# Patient Record
Sex: Female | Born: 1935 | Race: White | Hispanic: No | Smoking: Never smoker
Health system: Southern US, Community
[De-identification: ages and names within clinical notes are randomized; demographics above are authoritative.]

## PROBLEM LIST (undated history)

## (undated) DIAGNOSIS — N83209 Unspecified ovarian cyst, unspecified side: Secondary | ICD-10-CM

## (undated) DIAGNOSIS — I10 Essential (primary) hypertension: Secondary | ICD-10-CM

---

## 2004-04-18 ENCOUNTER — Ambulatory Visit: Payer: Self-pay | Admitting: Internal Medicine

## 2005-05-27 ENCOUNTER — Ambulatory Visit: Payer: Self-pay | Admitting: Internal Medicine

## 2006-06-17 ENCOUNTER — Ambulatory Visit: Payer: Self-pay | Admitting: Internal Medicine

## 2007-07-06 ENCOUNTER — Ambulatory Visit: Payer: Self-pay | Admitting: Internal Medicine

## 2008-07-10 ENCOUNTER — Ambulatory Visit: Payer: Self-pay | Admitting: Internal Medicine

## 2010-10-13 ENCOUNTER — Ambulatory Visit: Payer: Self-pay | Admitting: Oncology

## 2010-11-08 ENCOUNTER — Ambulatory Visit: Payer: Self-pay | Admitting: Internal Medicine

## 2010-11-12 ENCOUNTER — Ambulatory Visit: Payer: Self-pay | Admitting: Oncology

## 2010-11-13 ENCOUNTER — Ambulatory Visit: Payer: Self-pay | Admitting: Oncology

## 2010-11-13 LAB — CA 125: CA 125: 19 U/mL (ref 0.0–34.0)

## 2010-11-13 LAB — CANCER ANTIGEN 19-9: CA 19-9: 19 U/mL (ref 0–35)

## 2010-11-13 LAB — CEA: CEA: 2.8 ng/mL (ref 0.0–4.7)

## 2010-11-26 ENCOUNTER — Ambulatory Visit: Payer: Self-pay | Admitting: Oncology

## 2010-12-14 ENCOUNTER — Ambulatory Visit: Payer: Self-pay | Admitting: Oncology

## 2011-01-13 ENCOUNTER — Ambulatory Visit: Payer: Self-pay | Admitting: Oncology

## 2011-02-13 ENCOUNTER — Ambulatory Visit: Payer: Self-pay | Admitting: Oncology

## 2011-02-26 ENCOUNTER — Ambulatory Visit: Payer: Self-pay | Admitting: Oncology

## 2011-03-15 ENCOUNTER — Ambulatory Visit: Payer: Self-pay | Admitting: Oncology

## 2011-06-03 ENCOUNTER — Ambulatory Visit: Payer: Self-pay | Admitting: Oncology

## 2011-06-03 LAB — CBC CANCER CENTER
Basophil #: 0.1 x10 3/mm (ref 0.0–0.1)
Basophil %: 0.7 %
Eosinophil #: 0.1 x10 3/mm (ref 0.0–0.7)
HGB: 11.4 g/dL — ABNORMAL LOW (ref 12.0–16.0)
Lymphocyte #: 1.4 x10 3/mm (ref 1.0–3.6)
Lymphocyte %: 13.5 %
MCH: 35.2 pg — ABNORMAL HIGH (ref 26.0–34.0)
MCHC: 33.3 g/dL (ref 32.0–36.0)
MCV: 106 fL — ABNORMAL HIGH (ref 80–100)
Monocyte #: 0.7 x10 3/mm (ref 0.0–0.7)
Neutrophil %: 78 %
Platelet: 183 x10 3/mm (ref 150–440)
RBC: 3.24 10*6/uL — ABNORMAL LOW (ref 3.80–5.20)
RDW: 14.6 % — ABNORMAL HIGH (ref 11.5–14.5)

## 2011-06-03 LAB — COMPREHENSIVE METABOLIC PANEL
Albumin: 3.8 g/dL (ref 3.4–5.0)
Anion Gap: 5 — ABNORMAL LOW (ref 7–16)
BUN: 23 mg/dL — ABNORMAL HIGH (ref 7–18)
Creatinine: 1.06 mg/dL (ref 0.60–1.30)
EGFR (African American): >60
EGFR (Non-African Amer.): 52 — ABNORMAL LOW
Glucose: 86 mg/dL (ref 65–99)
Osmolality: 264 (ref 275–301)
Potassium: 4.2 mmol/L (ref 3.5–5.1)
SGOT(AST): 26 U/L (ref 15–37)
Sodium: 130 mmol/L — ABNORMAL LOW (ref 136–145)
Total Protein: 6.5 g/dL (ref 6.4–8.2)

## 2011-06-13 ENCOUNTER — Ambulatory Visit: Payer: Self-pay | Admitting: Oncology

## 2011-12-01 ENCOUNTER — Ambulatory Visit: Payer: Self-pay | Admitting: Oncology

## 2011-12-02 ENCOUNTER — Ambulatory Visit: Payer: Self-pay | Admitting: Oncology

## 2011-12-02 LAB — CBC CANCER CENTER
Basophil #: 0.1 x10 3/mm (ref 0.0–0.1)
Basophil %: 0.9 %
Eosinophil #: 0.1 x10 3/mm (ref 0.0–0.7)
HCT: 36.1 % (ref 35.0–47.0)
Lymphocyte %: 12.9 %
MCHC: 32.8 g/dL (ref 32.0–36.0)
Neutrophil #: 6.6 x10 3/mm — ABNORMAL HIGH (ref 1.4–6.5)
Neutrophil %: 77.6 %
RBC: 3.47 10*6/uL — ABNORMAL LOW (ref 3.80–5.20)
RDW: 14.3 % (ref 11.5–14.5)

## 2011-12-02 LAB — COMPREHENSIVE METABOLIC PANEL
Albumin: 3.7 g/dL (ref 3.4–5.0)
Anion Gap: 8 (ref 7–16)
Calcium, Total: 8.9 mg/dL (ref 8.5–10.1)
Chloride: 93 mmol/L — ABNORMAL LOW (ref 98–107)
Co2: 29 mmol/L (ref 21–32)
Glucose: 99 mg/dL (ref 65–99)
Osmolality: 265 (ref 275–301)
Potassium: 4.2 mmol/L (ref 3.5–5.1)
SGOT(AST): 24 U/L (ref 15–37)
Sodium: 130 mmol/L — ABNORMAL LOW (ref 136–145)

## 2011-12-14 ENCOUNTER — Ambulatory Visit: Payer: Self-pay | Admitting: Oncology

## 2012-06-12 ENCOUNTER — Ambulatory Visit: Payer: Self-pay | Admitting: Oncology

## 2012-06-22 ENCOUNTER — Ambulatory Visit: Payer: Self-pay | Admitting: Oncology

## 2012-06-29 LAB — COMPREHENSIVE METABOLIC PANEL
Albumin: 3.8 g/dL (ref 3.4–5.0)
BUN: 41 mg/dL — ABNORMAL HIGH (ref 7–18)
Calcium, Total: 8.6 mg/dL (ref 8.5–10.1)
Chloride: 96 mmol/L — ABNORMAL LOW (ref 98–107)
Co2: 31 mmol/L (ref 21–32)
EGFR (African American): 35 — ABNORMAL LOW
Glucose: 75 mg/dL (ref 65–99)
Potassium: 4.4 mmol/L (ref 3.5–5.1)
SGOT(AST): 20 U/L (ref 15–37)
SGPT (ALT): 19 U/L (ref 12–78)
Sodium: 133 mmol/L — ABNORMAL LOW (ref 136–145)
Total Protein: 6.6 g/dL (ref 6.4–8.2)

## 2012-06-29 LAB — CBC CANCER CENTER
Basophil #: 0.1 x10 3/mm (ref 0.0–0.1)
Basophil %: 1.3 %
Eosinophil #: 0.2 x10 3/mm (ref 0.0–0.7)
HGB: 11.5 g/dL — ABNORMAL LOW (ref 12.0–16.0)
Lymphocyte #: 1.7 x10 3/mm (ref 1.0–3.6)
Lymphocyte %: 18.8 %
MCH: 35 pg — ABNORMAL HIGH (ref 26.0–34.0)
MCHC: 33.8 g/dL (ref 32.0–36.0)
Neutrophil %: 69.1 %
RBC: 3.28 10*6/uL — ABNORMAL LOW (ref 3.80–5.20)
RDW: 14.5 % (ref 11.5–14.5)

## 2012-07-13 ENCOUNTER — Ambulatory Visit: Payer: Self-pay | Admitting: Oncology

## 2013-01-06 ENCOUNTER — Ambulatory Visit: Payer: Self-pay | Admitting: Oncology

## 2013-01-06 LAB — CBC CANCER CENTER
Basophil #: 0.1 x10 3/mm (ref 0.0–0.1)
Eosinophil %: 2.2 %
HCT: 35.1 % (ref 35.0–47.0)
HGB: 11.9 g/dL — ABNORMAL LOW (ref 12.0–16.0)
Lymphocyte %: 13.6 %
MCH: 35.3 pg — ABNORMAL HIGH (ref 26.0–34.0)
MCHC: 34 g/dL (ref 32.0–36.0)
Monocyte %: 9.3 %
Neutrophil %: 73.9 %
Platelet: 194 x10 3/mm (ref 150–440)
RBC: 3.38 10*6/uL — ABNORMAL LOW (ref 3.80–5.20)
WBC: 9.3 x10 3/mm (ref 3.6–11.0)

## 2013-01-06 LAB — COMPREHENSIVE METABOLIC PANEL
Alkaline Phosphatase: 83 U/L (ref 50–136)
Anion Gap: 4 — ABNORMAL LOW (ref 7–16)
Bilirubin,Total: 0.6 mg/dL (ref 0.2–1.0)
Calcium, Total: 8.7 mg/dL (ref 8.5–10.1)
Chloride: 98 mmol/L (ref 98–107)
Co2: 28 mmol/L (ref 21–32)
Creatinine: 1.29 mg/dL (ref 0.60–1.30)
EGFR (African American): 42 — ABNORMAL LOW
EGFR (Non-African Amer.): 36 — ABNORMAL LOW
Glucose: 94 mg/dL (ref 65–99)
Osmolality: 265 (ref 275–301)
SGOT(AST): 25 U/L (ref 15–37)
SGPT (ALT): 20 U/L (ref 12–78)
Total Protein: 6.6 g/dL (ref 6.4–8.2)

## 2013-01-12 ENCOUNTER — Ambulatory Visit: Payer: Self-pay | Admitting: Oncology

## 2013-01-21 IMAGING — CT CT ABD-PELV W/O CM
1 of 2 series · 15 of 32 positions shown, 19 images · non-contrast
Comparison: 11/08/2010

REASON FOR EXAM: ORAL ONLY Abd Mass Compare with Previous CT Scan
COMMENTS:

PROCEDURE:     CT  - CT ABDOMEN AND PELVIS W[DATE]  [DATE]
RESULT:     Indication: Abdominal mass
TECHNIQUE: Multiple axial images from the lung bases to the symphysis pubis
were obtained with oral and without intravenous contrast.

[Series 2: soft tissue · axial · 0.64mm/px · z∈[+67,+412]mm · 15 of 126 slices shown, 19 images]
[im 6/126  soft-tissue]
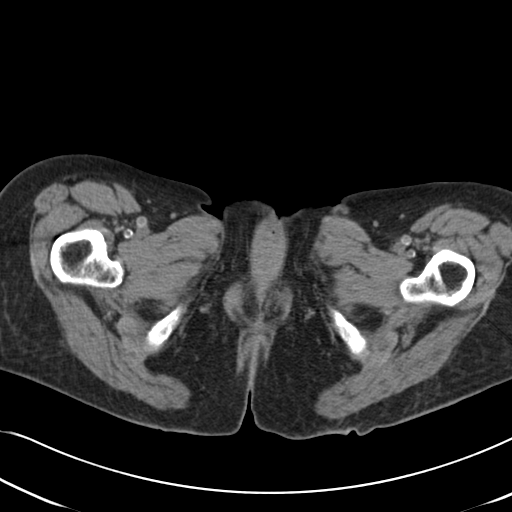
[im 6/126  bone]
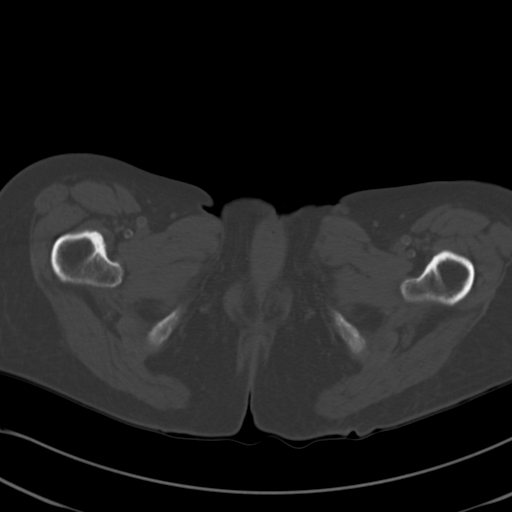
[im 16/126  soft-tissue]
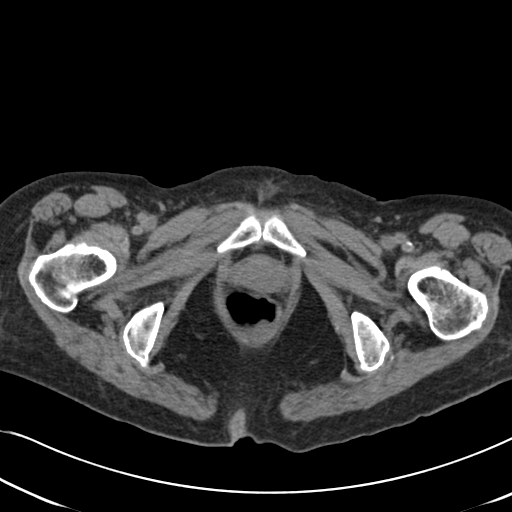
[im 26/126  soft-tissue]
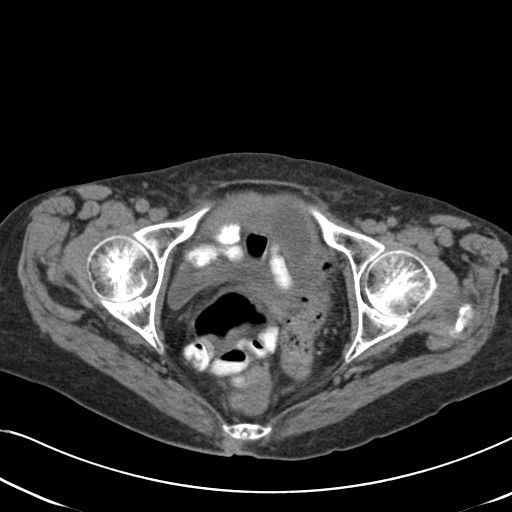
[im 36/126  soft-tissue]
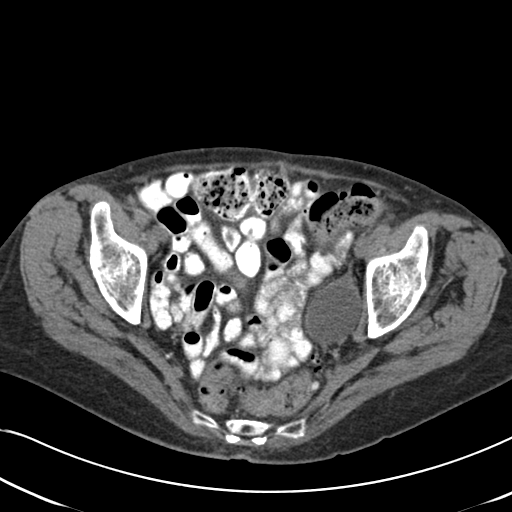
[im 46/126  soft-tissue]
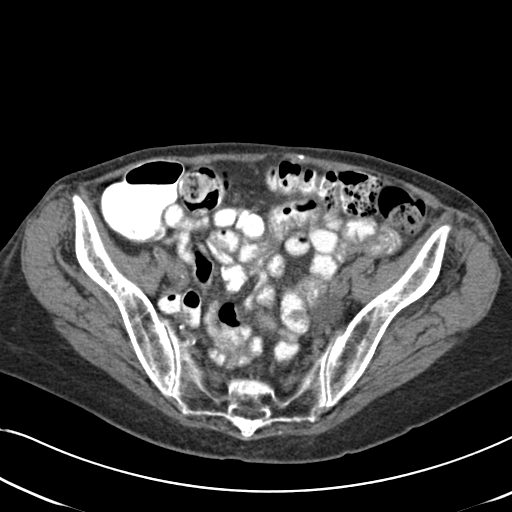
[im 56/126  soft-tissue]
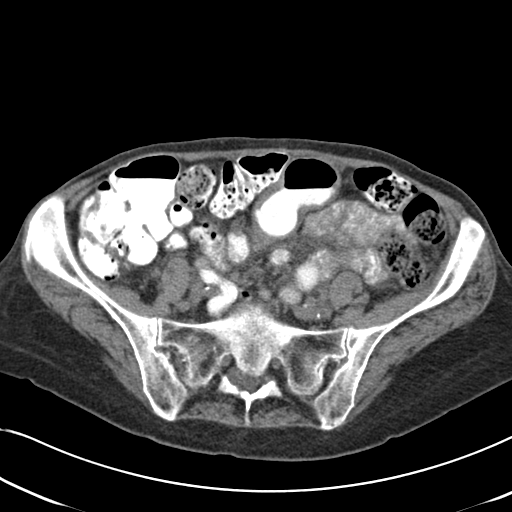
[im 66/126  soft-tissue]
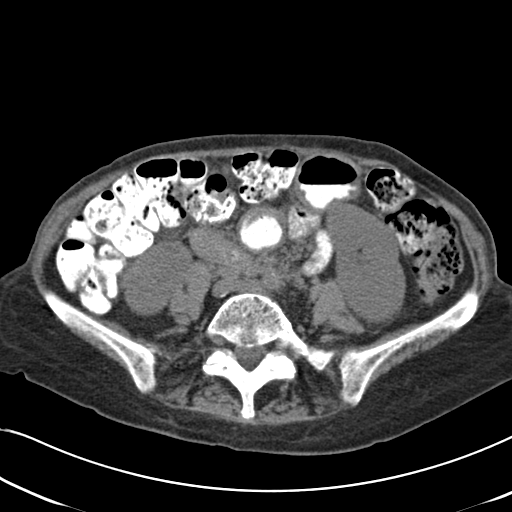
[im 71/126  soft-tissue]
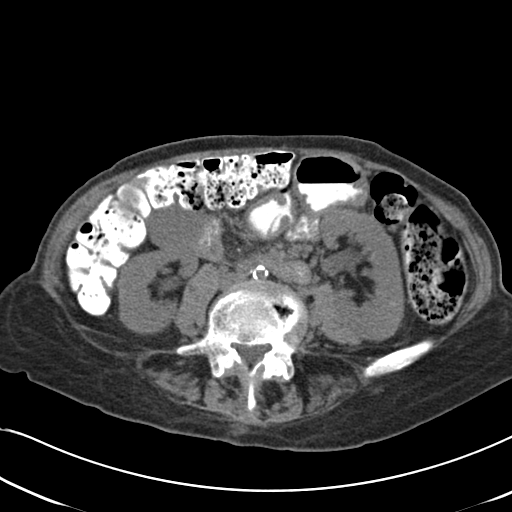
[im 81/126  soft-tissue]
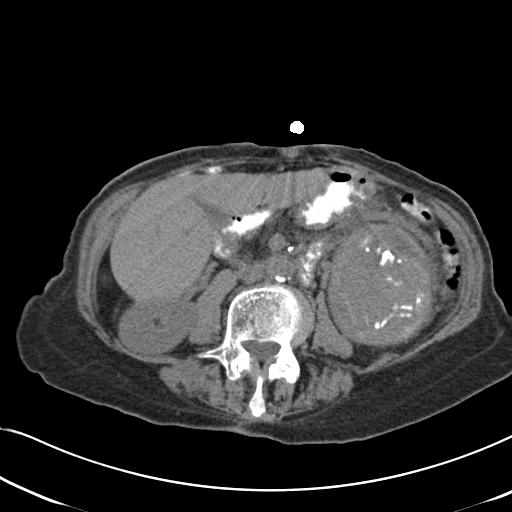
[im 81/126  bone]
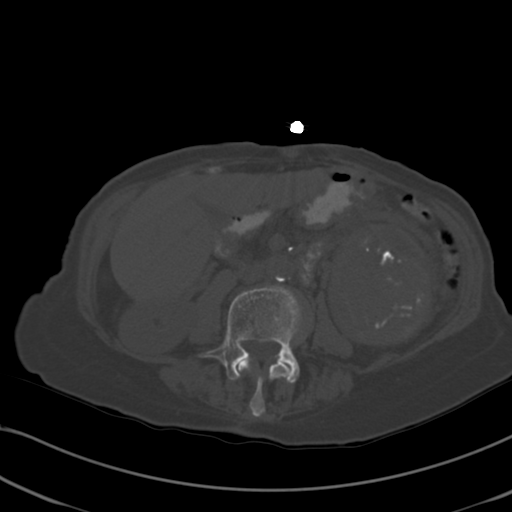
[im 91/126  soft-tissue]
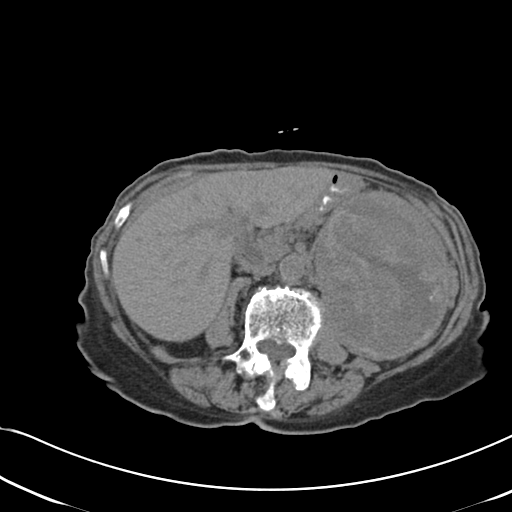
[im 101/126  soft-tissue]
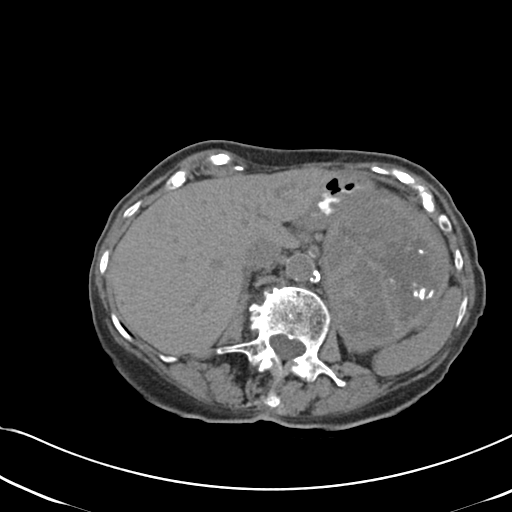
[im 106/126  lung]
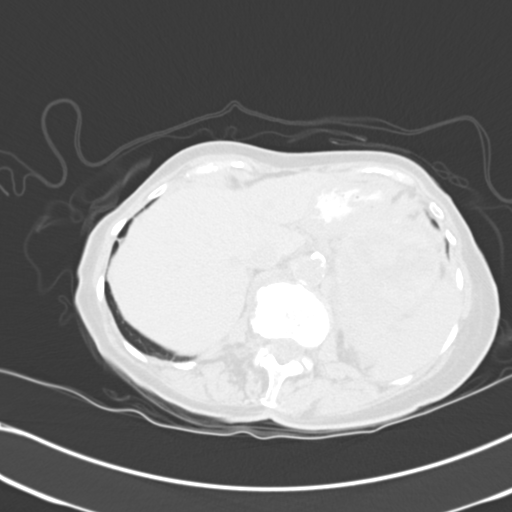
[im 111/126  soft-tissue]
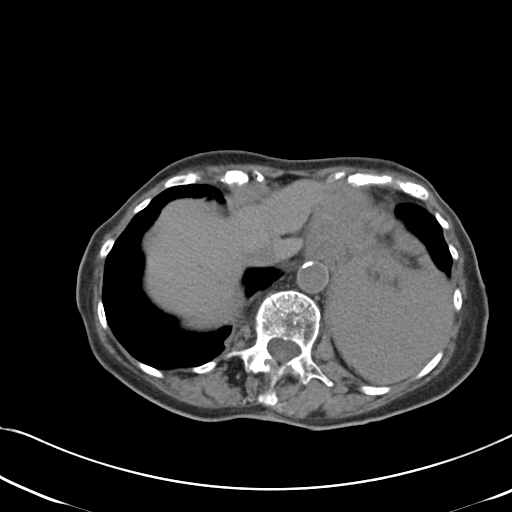
[im 111/126  lung]
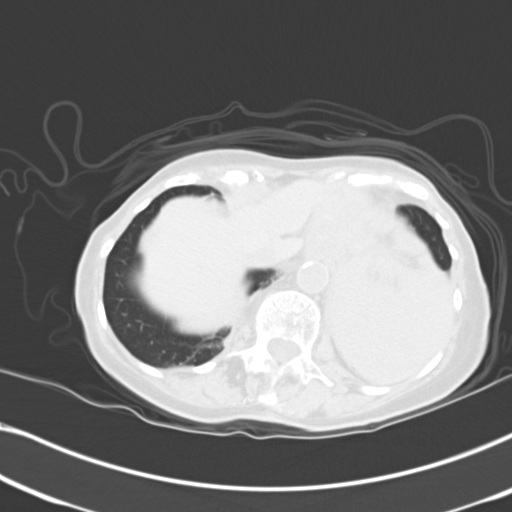
[im 116/126  lung]
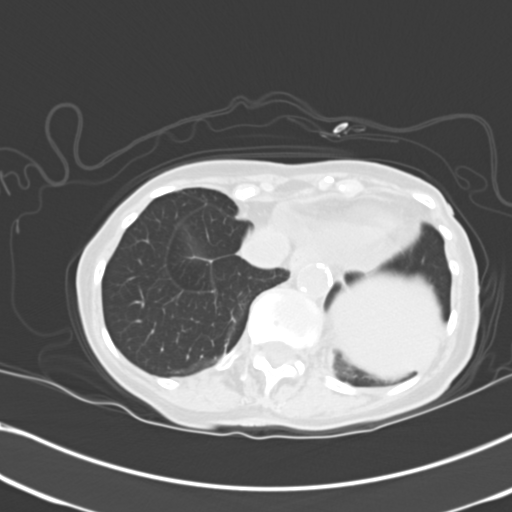
[im 121/126  soft-tissue]
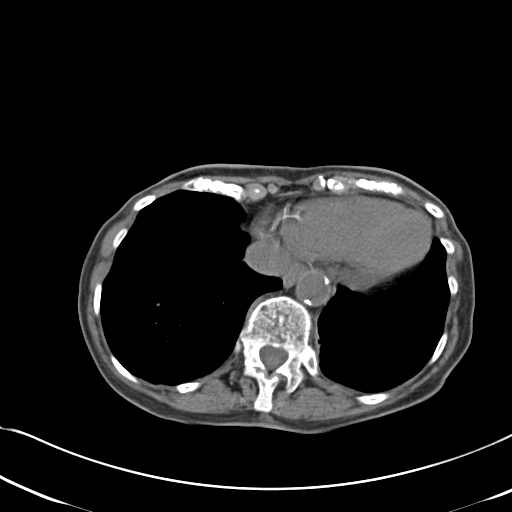
[im 121/126  lung]
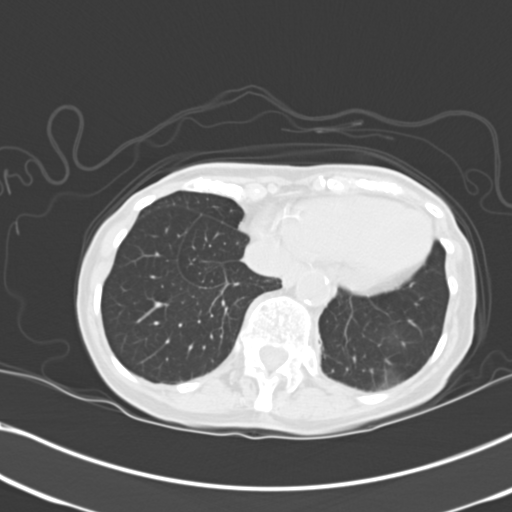

[15 of 32 positions shown; findings below may reference images not displayed]

FINDINGS: The lung bases are clear. There is no pleural or pericardial effusions.

No renal, ureteral, or bladder calculi. No obstructive uropathy. No
perinephric stranding is seen. The kidneys are symmetric in size without
evidence for exophytic mass. The bladder is unremarkable.

The liver demonstrates no focal abnormality. The gallbladder is
unremarkable. The spleen demonstrates no focal abnormality. The adrenal
glands and pancreas are normal.

There is a 11.1 x 8.8 x 10.9 cm left abdominal mass with areas of
low-attenuation in consistent with necrosis and associated coarse
calcifications. The appearance is most concerning for a gist tumor.

There is a 4.4 cm cystic left ovarian mass.

The stomach, duodenum, small intestine, and large intestine are
unremarkable.  There is no pneumoperitoneum, pneumatosis, or portal venous
gas. There is no abdominal or pelvic free fluid. There is no
lymphadenopathy.

The abdominal aorta is normal in caliber with atherosclerosis.

The osseous structures are unremarkable.
IMPRESSION: 1. There is a persistent stable left abdominal mass measuring 11.1 x 8.8 x
10.9 cm.
2. There is a 4.4 cm cystic left ovarian mass which is incompletely
characterized on this exam. Recommend further characterization with a pelvic
ultrasound given the patient's age.

## 2013-07-05 ENCOUNTER — Ambulatory Visit: Payer: Self-pay | Admitting: Oncology

## 2013-07-06 ENCOUNTER — Ambulatory Visit: Payer: Self-pay | Admitting: Oncology

## 2013-07-07 LAB — COMPREHENSIVE METABOLIC PANEL
ALBUMIN: 3.7 g/dL (ref 3.4–5.0)
ANION GAP: 10 (ref 7–16)
AST: 20 U/L (ref 15–37)
Alkaline Phosphatase: 65 U/L
BILIRUBIN TOTAL: 0.4 mg/dL (ref 0.2–1.0)
BUN: 32 mg/dL — ABNORMAL HIGH (ref 7–18)
CO2: 26 mmol/L (ref 21–32)
CREATININE: 1.29 mg/dL (ref 0.60–1.30)
Calcium, Total: 8.9 mg/dL (ref 8.5–10.1)
Chloride: 99 mmol/L (ref 98–107)
EGFR (Non-African Amer.): 36 — ABNORMAL LOW
GFR CALC AF AMER: 41 — AB
GLUCOSE: 90 mg/dL (ref 65–99)
OSMOLALITY: 277 (ref 275–301)
Potassium: 4.5 mmol/L (ref 3.5–5.1)
SGPT (ALT): 17 U/L (ref 12–78)
Sodium: 135 mmol/L — ABNORMAL LOW (ref 136–145)
Total Protein: 6.7 g/dL (ref 6.4–8.2)

## 2013-07-07 LAB — CBC CANCER CENTER
BASOS ABS: 0.2 x10 3/mm — AB (ref 0.0–0.1)
Basophil %: 1.8 %
EOS PCT: 2.3 %
Eosinophil #: 0.3 x10 3/mm (ref 0.0–0.7)
HCT: 35.2 % (ref 35.0–47.0)
HGB: 11.3 g/dL — ABNORMAL LOW (ref 12.0–16.0)
Lymphocyte #: 1.7 x10 3/mm (ref 1.0–3.6)
Lymphocyte %: 14.8 %
MCH: 34.1 pg — AB (ref 26.0–34.0)
MCHC: 32.2 g/dL (ref 32.0–36.0)
MCV: 106 fL — AB (ref 80–100)
MONOS PCT: 7.1 %
Monocyte #: 0.8 x10 3/mm (ref 0.2–0.9)
Neutrophil #: 8.6 x10 3/mm — ABNORMAL HIGH (ref 1.4–6.5)
Neutrophil %: 74 %
Platelet: 202 x10 3/mm (ref 150–440)
RBC: 3.33 10*6/uL — AB (ref 3.80–5.20)
RDW: 14.6 % — AB (ref 11.5–14.5)
WBC: 11.7 x10 3/mm — ABNORMAL HIGH (ref 3.6–11.0)

## 2013-07-13 ENCOUNTER — Ambulatory Visit: Payer: Self-pay | Admitting: Oncology

## 2014-01-12 ENCOUNTER — Ambulatory Visit: Payer: Self-pay | Admitting: Oncology

## 2014-01-12 LAB — COMPREHENSIVE METABOLIC PANEL
ALK PHOS: 76 U/L
Albumin: 3.9 g/dL (ref 3.4–5.0)
Anion Gap: 8 (ref 7–16)
BUN: 23 mg/dL — ABNORMAL HIGH (ref 7–18)
Bilirubin,Total: 0.6 mg/dL (ref 0.2–1.0)
Calcium, Total: 8.9 mg/dL (ref 8.5–10.1)
Chloride: 99 mmol/L (ref 98–107)
Co2: 28 mmol/L (ref 21–32)
Creatinine: 1.1 mg/dL (ref 0.60–1.30)
EGFR (Non-African Amer.): 49 — ABNORMAL LOW
GFR CALC AF AMER: 60 — AB
Glucose: 94 mg/dL (ref 65–99)
Osmolality: 274 (ref 275–301)
POTASSIUM: 4.6 mmol/L (ref 3.5–5.1)
SGOT(AST): 20 U/L (ref 15–37)
SGPT (ALT): 22 U/L
Sodium: 135 mmol/L — ABNORMAL LOW (ref 136–145)
Total Protein: 6.3 g/dL — ABNORMAL LOW (ref 6.4–8.2)

## 2014-01-12 LAB — CBC CANCER CENTER
BASOS PCT: 1.1 %
Basophil #: 0.1 x10 3/mm (ref 0.0–0.1)
Eosinophil #: 0.3 x10 3/mm (ref 0.0–0.7)
Eosinophil %: 2.5 %
HCT: 36.3 % (ref 35.0–47.0)
HGB: 12 g/dL (ref 12.0–16.0)
Lymphocyte #: 1.5 x10 3/mm (ref 1.0–3.6)
Lymphocyte %: 14.9 %
MCH: 35.9 pg — AB (ref 26.0–34.0)
MCHC: 33 g/dL (ref 32.0–36.0)
MCV: 109 fL — ABNORMAL HIGH (ref 80–100)
Monocyte #: 0.7 x10 3/mm (ref 0.2–0.9)
Monocyte %: 7 %
NEUTROS PCT: 74.5 %
Neutrophil #: 7.4 x10 3/mm — ABNORMAL HIGH (ref 1.4–6.5)
Platelet: 177 x10 3/mm (ref 150–440)
RBC: 3.34 10*6/uL — AB (ref 3.80–5.20)
RDW: 14.9 % — ABNORMAL HIGH (ref 11.5–14.5)
WBC: 9.9 x10 3/mm (ref 3.6–11.0)

## 2014-02-12 ENCOUNTER — Ambulatory Visit: Payer: Self-pay | Admitting: Oncology

## 2014-04-05 DIAGNOSIS — R413 Other amnesia: Secondary | ICD-10-CM | POA: Insufficient documentation

## 2014-06-03 IMAGING — US ABDOMEN ULTRASOUND
2 series · 13 of 25 positions shown · non-contrast
Comparison: none

REASON FOR EXAM: Kidney and ovarian mass
COMMENTS:

[Series 1: abdomen ultrasound · 0.18mm/px · 12 of 140 slices shown (1 of 2)]
[im 1/140]
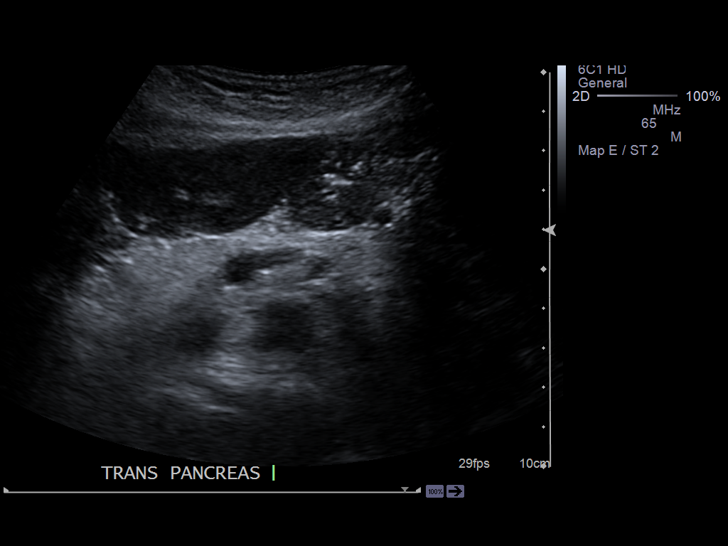
[im 13/140]
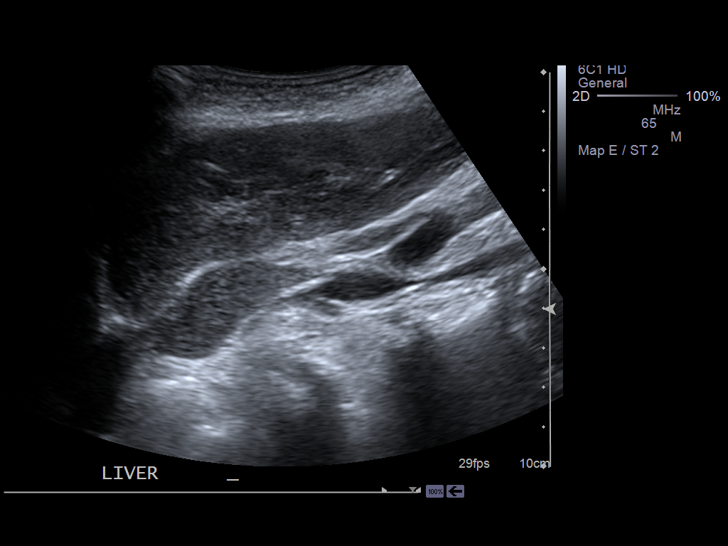
[im 26/140]
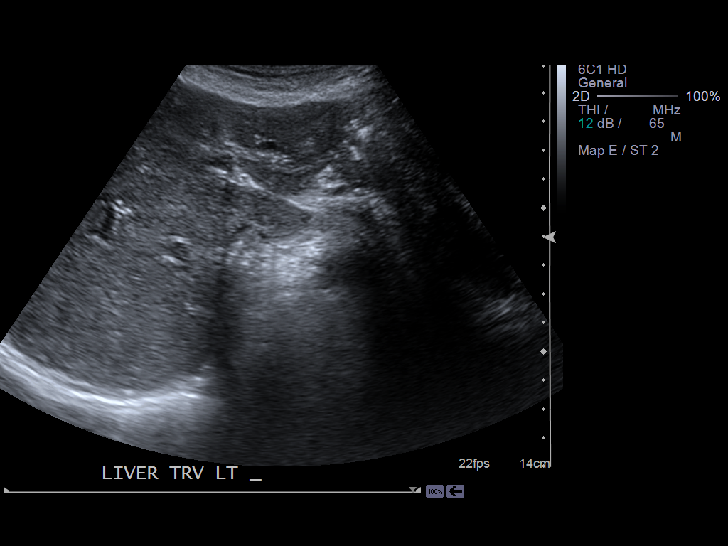
[im 38/140]
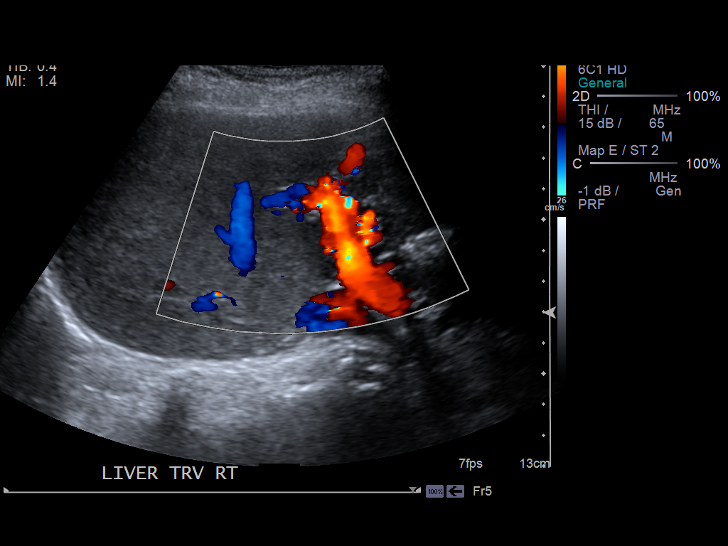
[im 51/140]
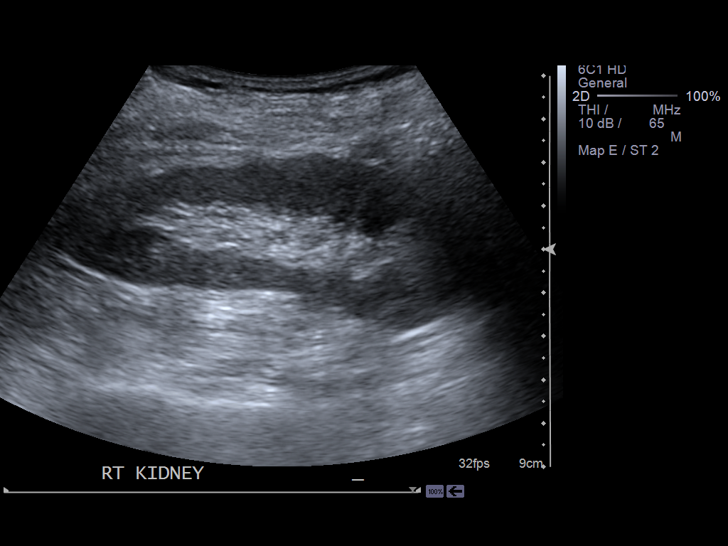
[im 64/140]
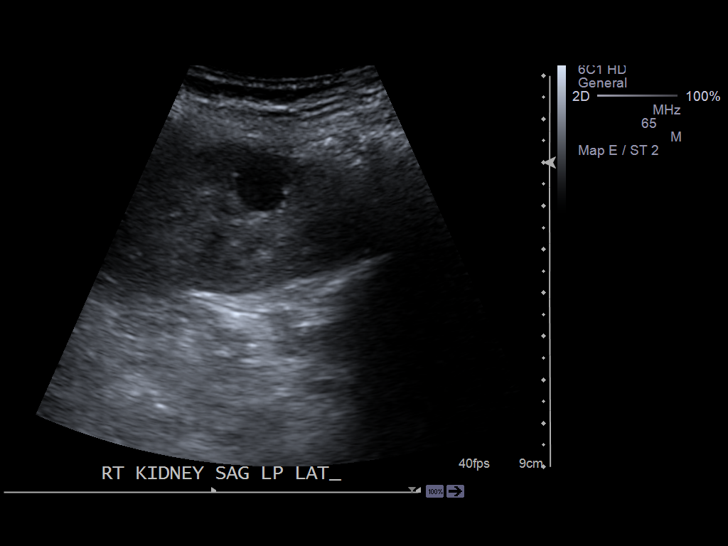
[im 76/140]
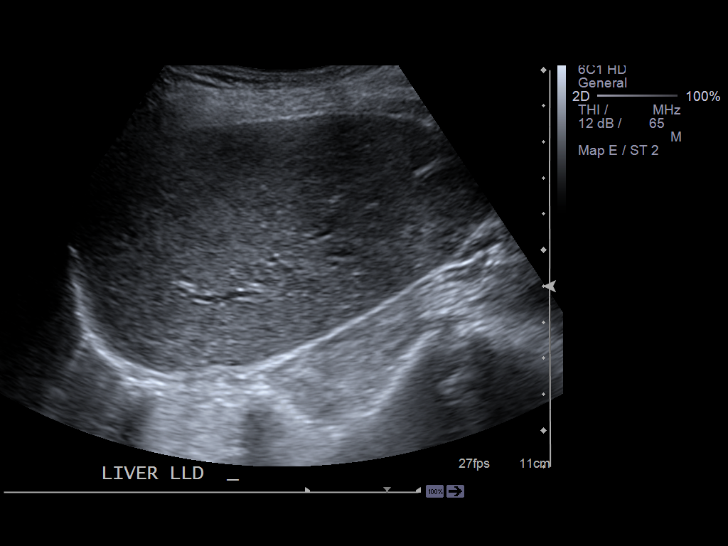
[im 89/140]
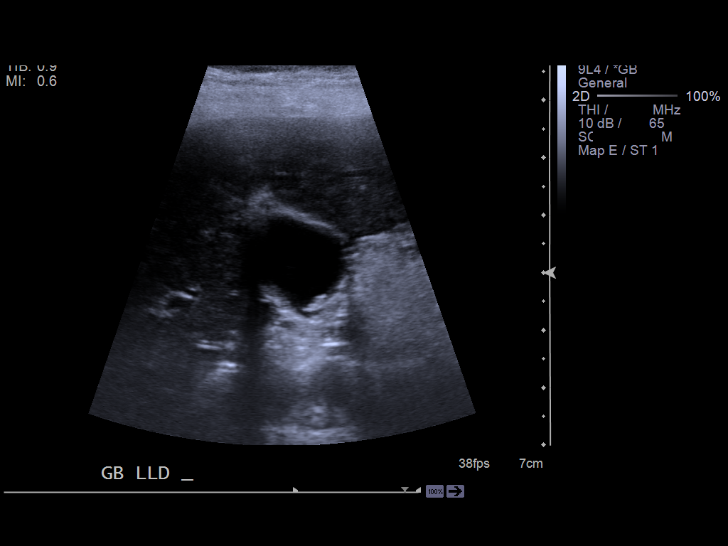
[im 102/140]
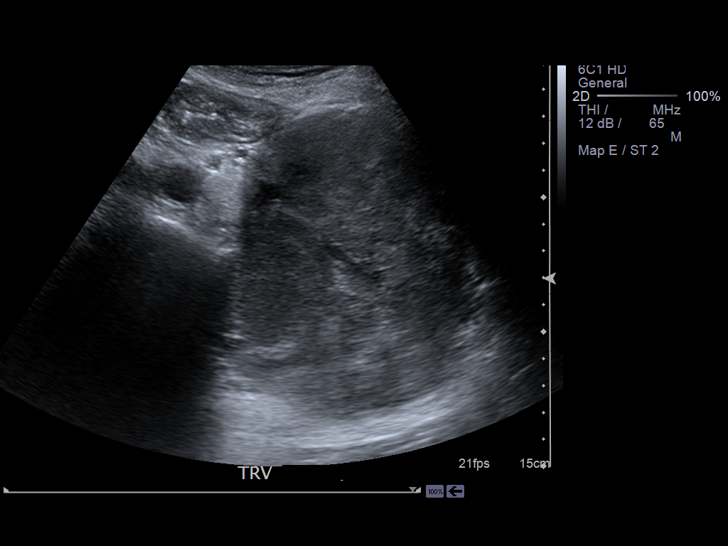
[im 114/140]
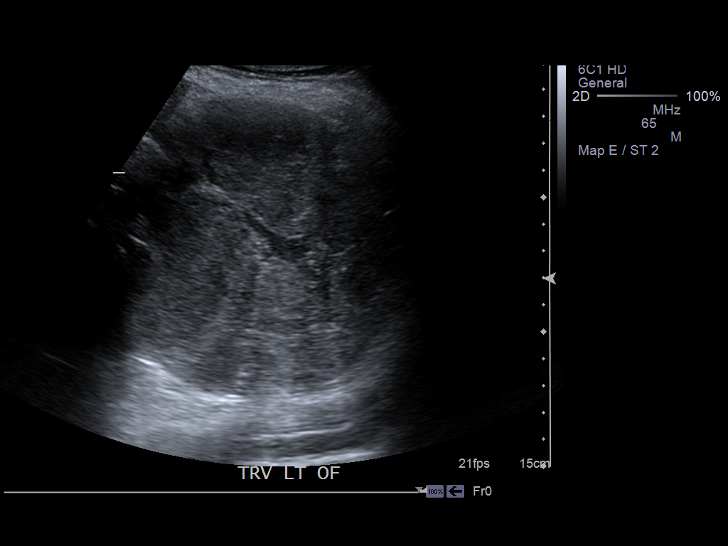
[im 127/140]
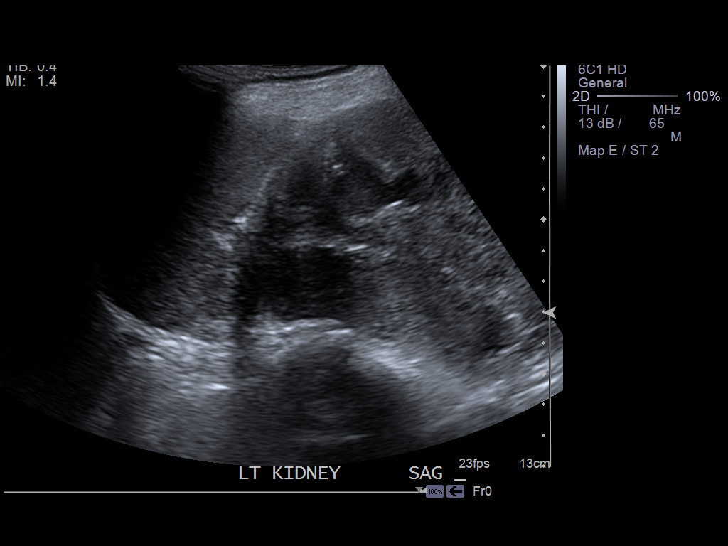
[im 140/140]
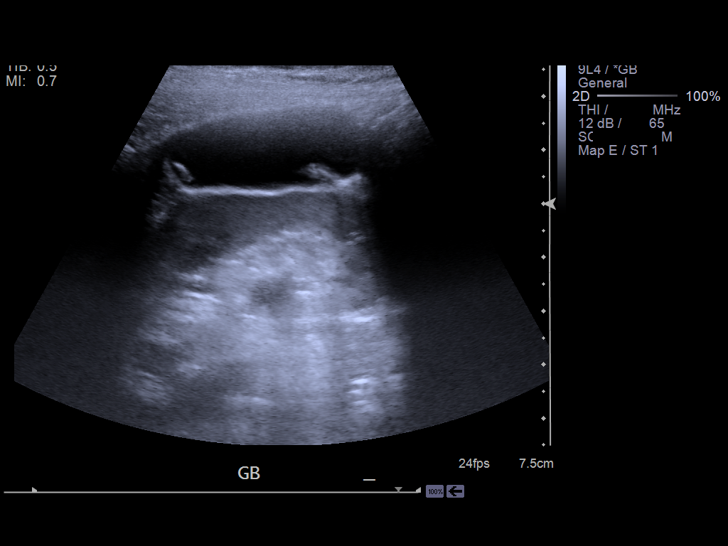

[Series 2001: abdomen ultrasound · 0.23mm/px · 1 of 15 slices shown (2 of 2)]
[im 15/15]
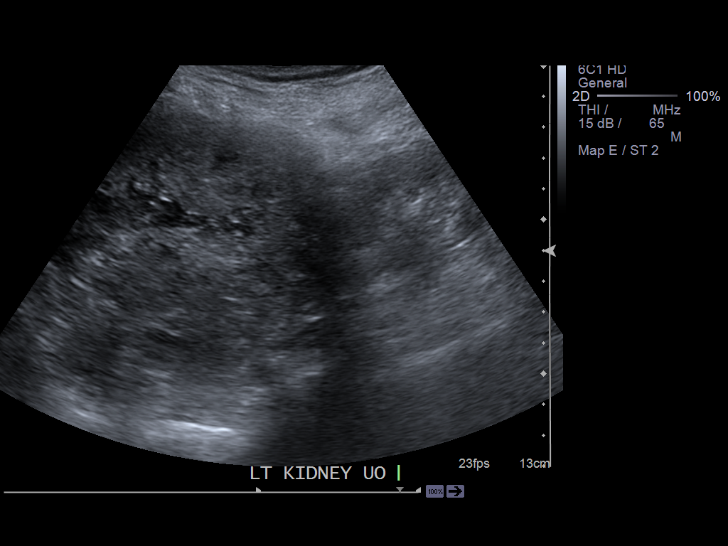

[13 of 25 positions shown; findings below may reference images not displayed]

PROCEDURE:     US  - US ABDOMEN GENERAL SURVEY  - June 22, 2012 [DATE]

RESULT:     Abdominal sonographic evaluation demonstrates limited
visualization of the area the pancreas. The proximal inferior vena cava is
widely patent. The aorta shows no aneurysmal dilation and appears to taper
to 1.27 cm diameter distally. The hepatic echotexture is normal. No discrete
mass is evident. The hepatic length is 13.71 cm. The portal venous flow is
normal. Gallbladder wall thickness is 1.3 mm. The right kidney measures
x 3.65 x 3.57 cm and contains a lower pole cyst measuring up to 1.39 cm
diameter actually more the junction of the mid and lower pole regions. The
common bile duct diameter is 3.4 mm. There is no intrahepatic biliary ductal
dilation. Small echogenic minimally shadowing stones appear present in the
gallbladder. These are mobile and do not show significant shadowing.
Sludgeball is felt to be less likely.

There is a large complex mass in the left abdomen measuring up to 11.6 cm x
9.72 cm x 11.5 cm. The spleen measures 8.9 cm in length.  The borders the
left kidney are not well demonstrated. The kidney measures 9.14 x 4.55 x
3.70 cm.
IMPRESSION: Large left abdominal mass. The patient has undergone
previous CT scan. Right renal cyst. Cholelithiasis without evidence of acute
cholecystitis.

[REDACTED]

## 2014-06-03 IMAGING — US TRANSABDOMINAL ULTRASOUND OF PELVIS
1 series · 14 of 25 positions shown · non-contrast
Comparison: none

REASON FOR EXAM: Kidney and ovarian mass
COMMENTS:

[Series 1: transabdominal ultrasound of pelvis · 0.28mm/px · 14 of 52 slices shown]
[im 1/52]
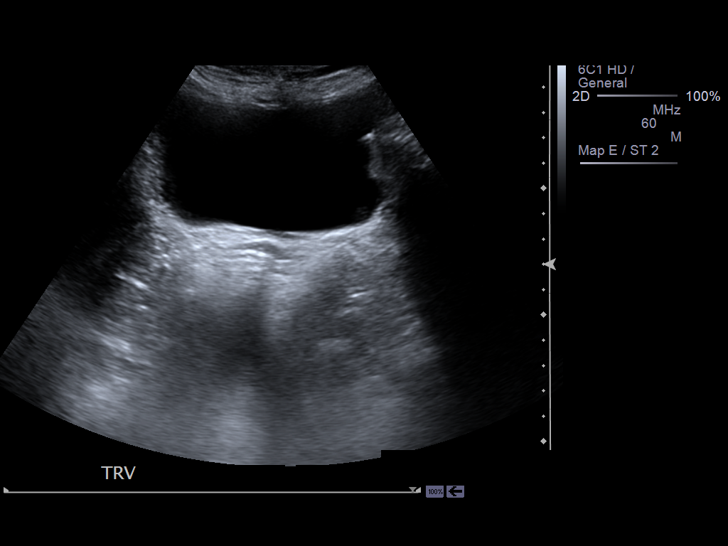
[im 5/52]
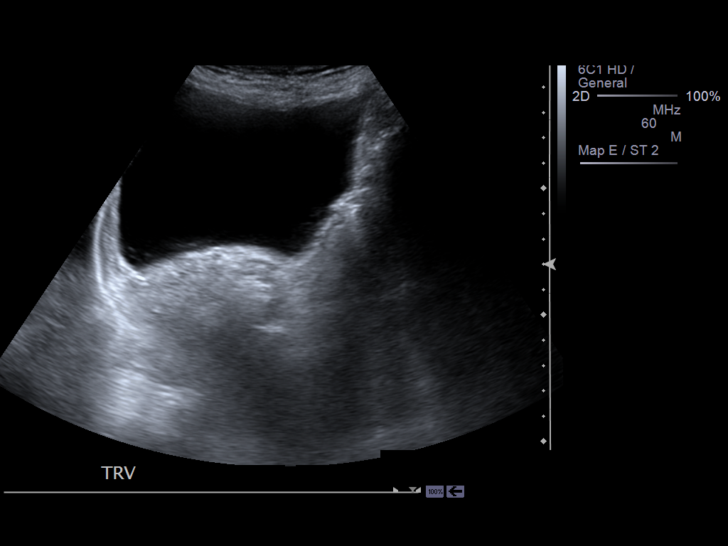
[im 9/52]
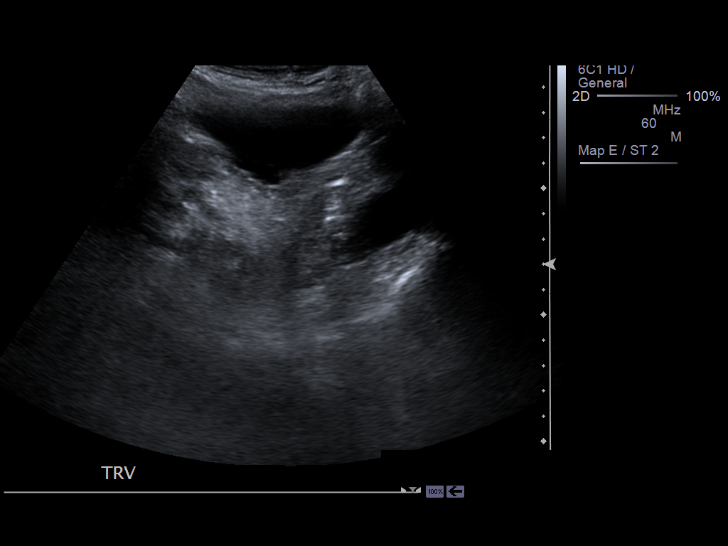
[im 13/52]
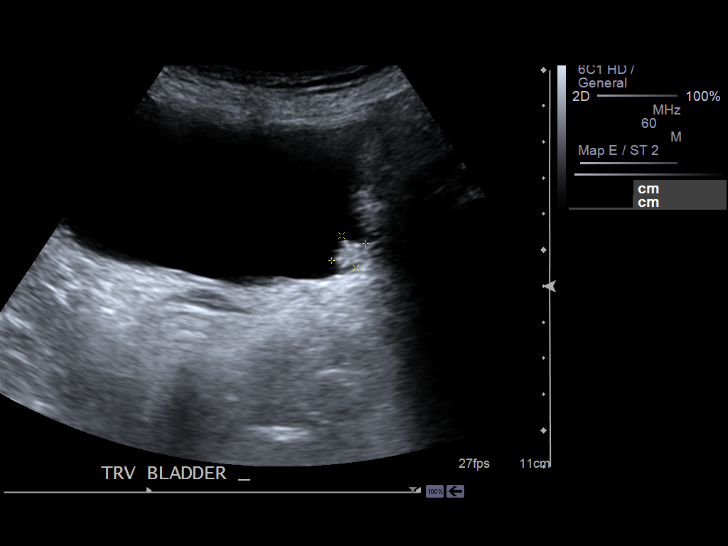
[im 18/52]
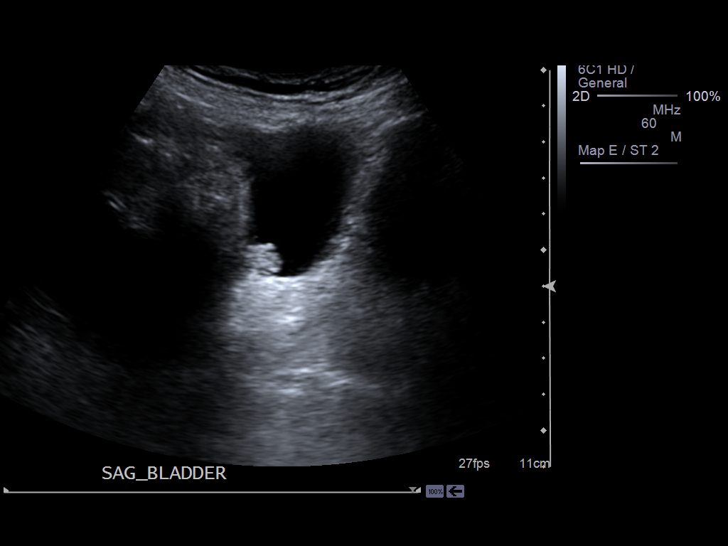
[im 20/52]
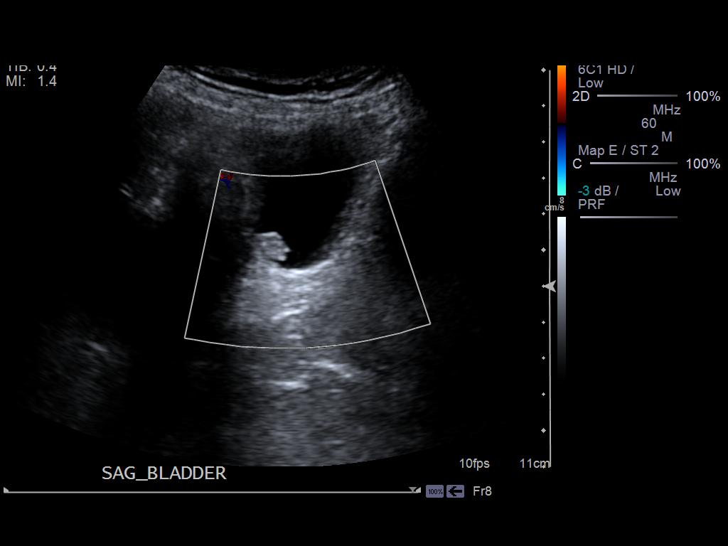
[im 24/52]
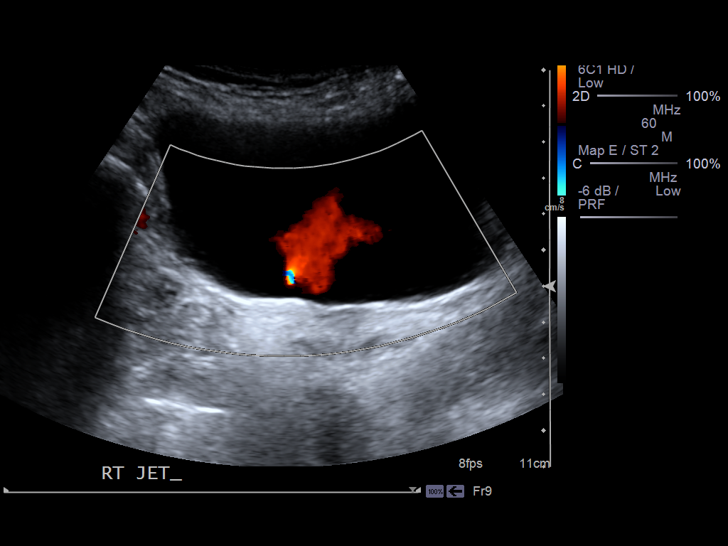
[im 28/52]
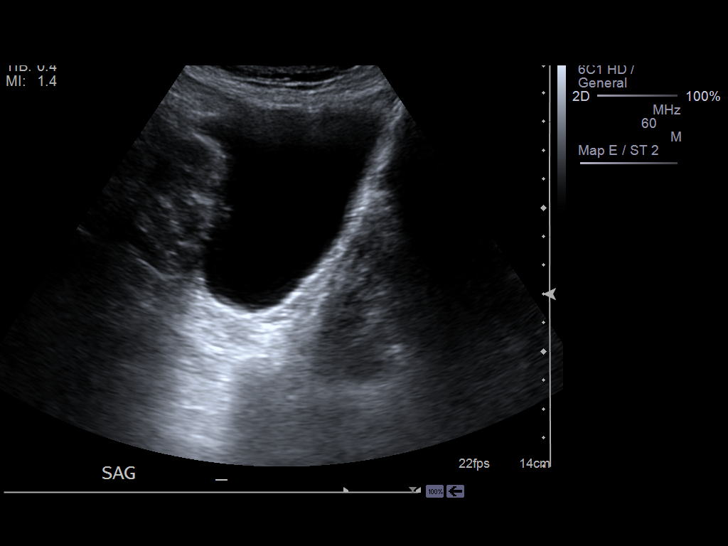
[im 32/52]
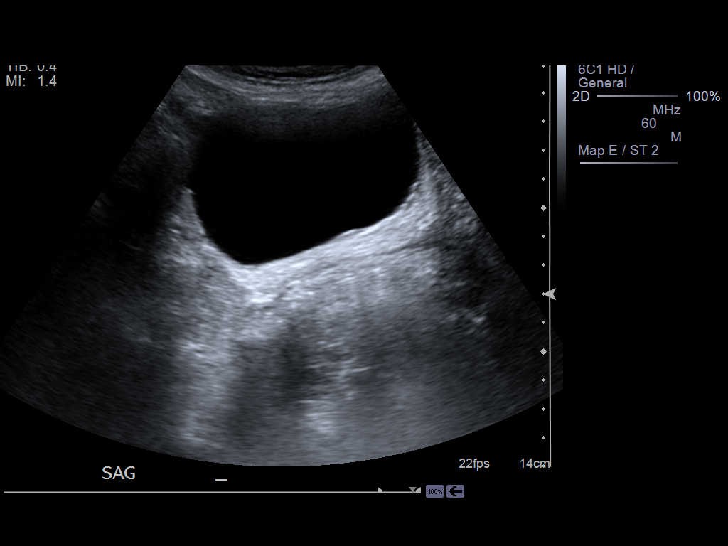
[im 35/52]
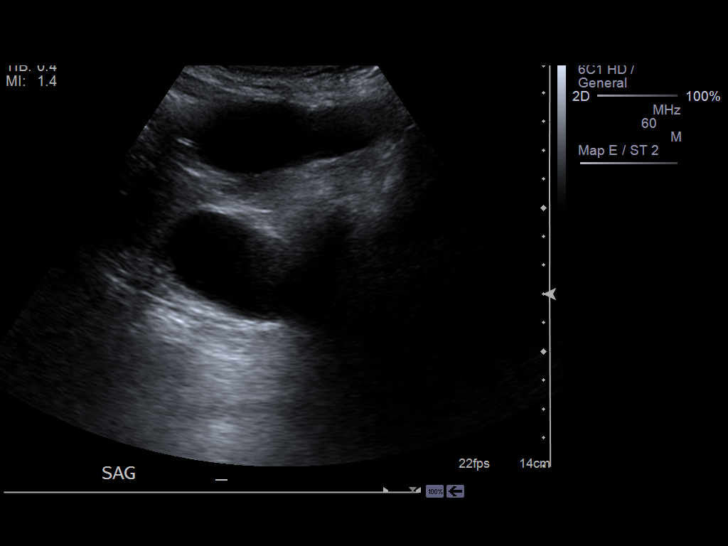
[im 39/52]
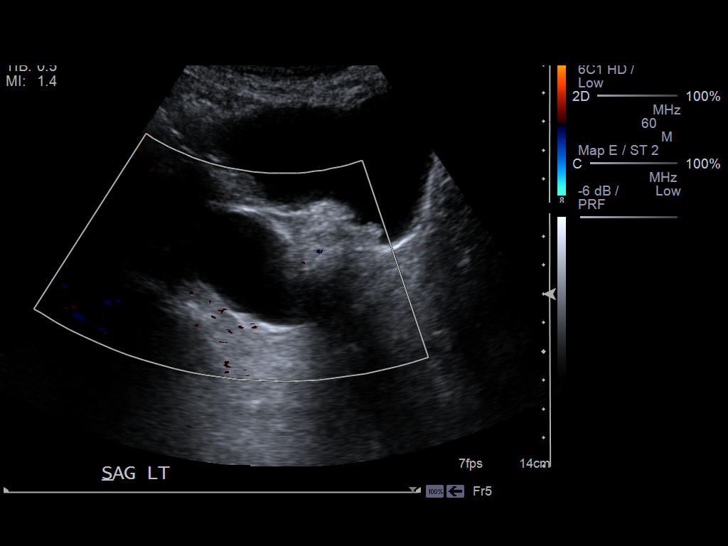
[im 43/52]
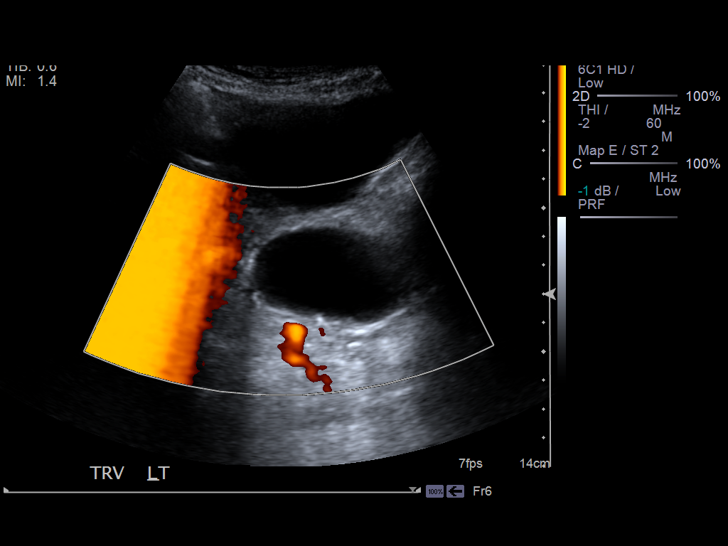
[im 47/52]
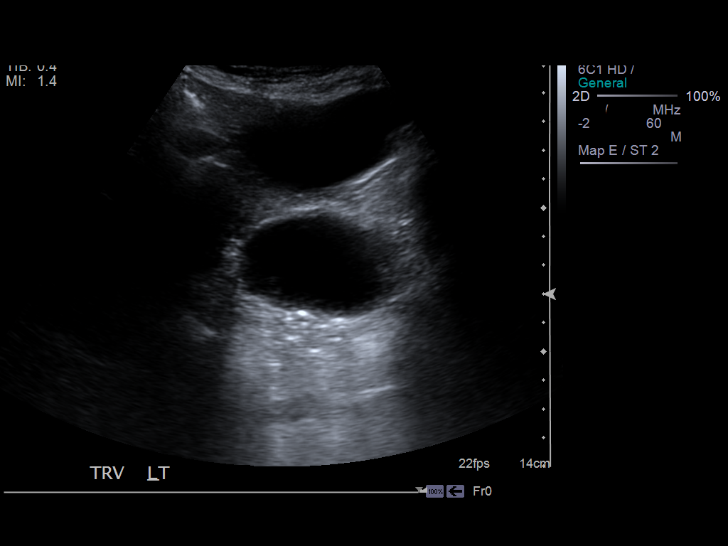
[im 52/52]
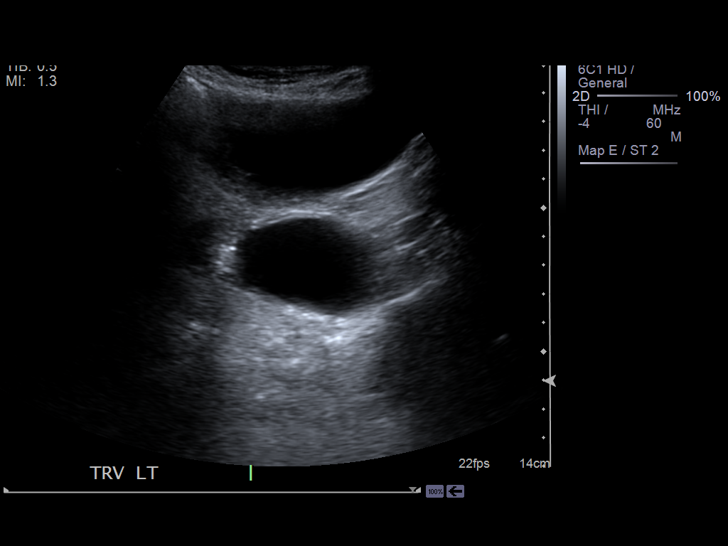

[14 of 25 positions shown; findings below may reference images not displayed]

PROCEDURE:     US  - US PELVIS EXAM  - June 22, 2012 [DATE]

RESULT:     The uterus has been removed. The right ovary is not visualized.
Previous studies demonstrate left ovarian cystic mass. Today's
transabdominal and endovaginal images demonstrate a mass in the bladder
posteriorly to the left measuring 11.2 mm. There is evidence of internal
blood flow within this mass. Underlying bladder cancer is a concern. There
is a cystic mass in the left adnexal region measuring 4.93 x 2.0 x 5.44 cm.
There is no associated soft tissue component. The a mass in the bladder
appears to measure slightly greater than on the previous study from 20 November, 2010. The left adnexal mass is similar in size to the previous study.
IMPRESSION: No gross change in the left bladder mass or left adnexal
cystic mass. Please see above.

[REDACTED]

## 2014-07-12 ENCOUNTER — Ambulatory Visit
Admit: 2014-07-12 | Disposition: A | Discharge status: Home / Self Care | Payer: Self-pay | Attending: Oncology | Admitting: Oncology

## 2014-07-14 ENCOUNTER — Ambulatory Visit
Admit: 2014-07-14 | Disposition: A | Discharge status: Home / Self Care | Payer: Self-pay | Attending: Oncology | Admitting: Oncology

## 2014-07-20 LAB — COMPREHENSIVE METABOLIC PANEL
ALBUMIN: 4 g/dL
ALT: 15 U/L
AST: 21 U/L
Alkaline Phosphatase: 64 U/L
Anion Gap: 4 — ABNORMAL LOW (ref 7–16)
BUN: 34 mg/dL — AB
Bilirubin,Total: 0.7 mg/dL
Calcium, Total: 8.6 mg/dL — ABNORMAL LOW
Chloride: 99 mmol/L — ABNORMAL LOW
Co2: 28 mmol/L
Creatinine: 1.2 mg/dL — ABNORMAL HIGH
EGFR (African American): 45 — ABNORMAL LOW
EGFR (Non-African Amer.): 39 — ABNORMAL LOW
Glucose: 85 mg/dL
POTASSIUM: 4.4 mmol/L
Sodium: 131 mmol/L — ABNORMAL LOW
TOTAL PROTEIN: 6.3 g/dL — AB

## 2014-07-20 LAB — CBC CANCER CENTER
BASOS ABS: 0.1 x10 3/mm (ref 0.0–0.1)
Basophil %: 1.1 %
EOS PCT: 1.6 %
Eosinophil #: 0.2 x10 3/mm (ref 0.0–0.7)
HCT: 33 % — ABNORMAL LOW (ref 35.0–47.0)
HGB: 11.1 g/dL — AB (ref 12.0–16.0)
Lymphocyte #: 1.5 x10 3/mm (ref 1.0–3.6)
Lymphocyte %: 14 %
MCH: 35.9 pg — AB (ref 26.0–34.0)
MCHC: 33.8 g/dL (ref 32.0–36.0)
MCV: 106 fL — AB (ref 80–100)
Monocyte #: 0.8 x10 3/mm (ref 0.2–0.9)
Monocyte %: 7.1 %
NEUTROS PCT: 76.2 %
Neutrophil #: 8.3 x10 3/mm — ABNORMAL HIGH (ref 1.4–6.5)
Platelet: 174 x10 3/mm (ref 150–440)
RBC: 3.1 10*6/uL — ABNORMAL LOW (ref 3.80–5.20)
RDW: 15.3 % — ABNORMAL HIGH (ref 11.5–14.5)
WBC: 10.9 x10 3/mm (ref 3.6–11.0)

## 2014-09-12 ENCOUNTER — Other Ambulatory Visit: Payer: Self-pay | Admitting: *Deleted

## 2014-09-12 MED ORDER — TRAMADOL HCL 50 MG PO TABS: 50.0000 mg | ORAL_TABLET | Freq: Four times a day (QID) | ORAL | Status: DC | PRN

## 2014-09-12 NOTE — Telephone Encounter (Signed)
rx faxed

## 2014-10-16 ENCOUNTER — Emergency Department
Admission: EM | Admit: 2014-10-16 | Discharge: 2014-10-16 | Disposition: A | Discharge status: Home / Self Care | Payer: Medicare Other | Attending: Emergency Medicine | Admitting: Emergency Medicine

## 2014-10-16 ENCOUNTER — Encounter: Payer: Self-pay | Admitting: *Deleted

## 2014-10-16 ENCOUNTER — Emergency Department: Payer: Medicare Other

## 2014-10-16 DIAGNOSIS — M25552 Pain in left hip: Secondary | ICD-10-CM | POA: Insufficient documentation

## 2014-10-16 DIAGNOSIS — G8929 Other chronic pain: Secondary | ICD-10-CM | POA: Insufficient documentation

## 2014-10-16 HISTORY — DX: Essential (primary) hypertension: I10

## 2014-10-16 HISTORY — DX: Unspecified ovarian cyst, unspecified side: N83.209

## 2014-10-16 NOTE — Discharge Instructions (Signed)
Arthritis, Nonspecific °Arthritis is inflammation of a joint. This usually means pain, redness, warmth or swelling are present. One or more joints may be involved. There are a number of types of arthritis. Your caregiver may not be able to tell what type of arthritis you have right away. °CAUSES  °The most common cause of arthritis is the wear and tear on the joint (osteoarthritis). This causes damage to the cartilage, which can break down over time. The knees, hips, back and neck are most often affected by this type of arthritis. °Other types of arthritis and common causes of joint pain include: °· Sprains and other injuries near the joint. Sometimes minor sprains and injuries cause pain and swelling that develop hours later. °· Rheumatoid arthritis. This affects hands, feet and knees. It usually affects both sides of your body at the same time. It is often associated with chronic ailments, fever, weight loss and general weakness. °· Crystal arthritis. Gout and pseudo gout can cause occasional acute severe pain, redness and swelling in the foot, ankle, or knee. °· Infectious arthritis. Bacteria can get into a joint through a break in overlying skin. This can cause infection of the joint. Bacteria and viruses can also spread through the blood and affect your joints. °· Drug, infectious and allergy reactions. Sometimes joints can become mildly painful and slightly swollen with these types of illnesses. °SYMPTOMS  °· Pain is the main symptom. °· Your joint or joints can also be red, swollen and warm or hot to the touch. °· You may have a fever with certain types of arthritis, or even feel overall ill. °· The joint with arthritis will hurt with movement. Stiffness is present with some types of arthritis. °DIAGNOSIS  °Your caregiver will suspect arthritis based on your description of your symptoms and on your exam. Testing may be needed to find the type of arthritis: °· Blood and sometimes urine tests. °· X-ray tests  and sometimes CT or MRI scans. °· Removal of fluid from the joint (arthrocentesis) is done to check for bacteria, crystals or other causes. Your caregiver (or a specialist) will numb the area over the joint with a local anesthetic, and use a needle to remove joint fluid for examination. This procedure is only minimally uncomfortable. °· Even with these tests, your caregiver may not be able to tell what kind of arthritis you have. Consultation with a specialist (rheumatologist) may be helpful. °TREATMENT  °Your caregiver will discuss with you treatment specific to your type of arthritis. If the specific type cannot be determined, then the following general recommendations may apply. °Treatment of severe joint pain includes: °· Rest. °· Elevation. °· Anti-inflammatory medication (for example, ibuprofen) may be prescribed. Avoiding activities that cause increased pain. °· Only take over-the-counter or prescription medicines for pain and discomfort as recommended by your caregiver. °· Cold packs over an inflamed joint may be used for 10 to 15 minutes every hour. Hot packs sometimes feel better, but do not use overnight. Do not use hot packs if you are diabetic without your caregiver's permission. °· A cortisone shot into arthritic joints may help reduce pain and swelling. °· Any acute arthritis that gets worse over the next 1 to 2 days needs to be looked at to be sure there is no joint infection. °Long-term arthritis treatment involves modifying activities and lifestyle to reduce joint stress jarring. This can include weight loss. Also, exercise is needed to nourish the joint cartilage and remove waste. This helps keep the muscles   around the joint strong. °HOME CARE INSTRUCTIONS  °· Do not take aspirin to relieve pain if gout is suspected. This elevates uric acid levels. °· Only take over-the-counter or prescription medicines for pain, discomfort or fever as directed by your caregiver. °· Rest the joint as much as  possible. °· If your joint is swollen, keep it elevated. °· Use crutches if the painful joint is in your leg. °· Drinking plenty of fluids may help for certain types of arthritis. °· Follow your caregiver's dietary instructions. °· Try low-impact exercise such as: °¨ Swimming. °¨ Water aerobics. °¨ Biking. °¨ Walking. °· Morning stiffness is often relieved by a warm shower. °· Put your joints through regular range-of-motion. °SEEK MEDICAL CARE IF:  °· You do not feel better in 24 hours or are getting worse. °· You have side effects to medications, or are not getting better with treatment. °SEEK IMMEDIATE MEDICAL CARE IF:  °· You have a fever. °· You develop severe joint pain, swelling or redness. °· Many joints are involved and become painful and swollen. °· There is severe back pain and/or leg weakness. °· You have loss of bowel or bladder control. °Document Released: 05/08/2004 Document Revised: 06/23/2011 Document Reviewed: 05/24/2008 °ExitCare® Patient Information ©2015 [employer]. This information is not intended to replace advice given to you by your health care provider. Make sure you discuss any questions you have with your health care provider. ° °

## 2014-10-16 NOTE — ED Provider Notes (Signed)
[facility] Emergency Department Provider Note  ____________________________________________  Time seen: 4:15 PM  I have reviewed the triage vital signs and the nursing notes.   HISTORY  Chief Complaint Hip Pain    HPI Karen Bartlett is a 81 y.o. female who complains of acute on chronic left hip pain for last 2 days. She has a history of chronic left hip pain due to arthritis that's been followed extensively by orthopedics including routine injections of the left hip and problems with bursitis. She notes that about 2 days ago she just started having increased pain in the left hip without any other issues. She still is able to relay but has worsened pain when she stands up and bears weight on the hip. She's not had any all that is associated with this time course although 2 weeks ago she did have a minor fall at home today when she fell onto a soft sofa.  Patient's son does report that the patient has a history of anxiety and has been very fearful of falling at home.     Past Medical History  Diagnosis Date  . Ovarian cyst   . Hypertension     There are no active problems to display for this patient.   History reviewed. No pertinent past surgical history.  Current Outpatient Rx  Name  Route  Sig  Dispense  Refill  . traMADol (ULTRAM) 50 MG tablet   Oral   Take 1 tablet (50 mg total) by mouth 4 (four) times daily as needed.   270 tablet   2     Allergies Review of patient's allergies indicates no known allergies.  No family history on file.  Social History History  Substance Use Topics  . Smoking status: Never Smoker   . Smokeless tobacco: Not on file  . Alcohol Use: No    Review of Systems  Constitutional: No fever or chills. No weight changes Eyes:No blurry vision or double vision.  ENT: No sore throat. Cardiovascular: No chest pain. Respiratory: No dyspnea or cough. Gastrointestinal: Negative for abdominal pain, vomiting  and diarrhea.  No BRBPR or melena. Genitourinary: Negative for dysuria, urinary retention, bloody urine, or difficulty urinating. Musculoskeletal: Left hip pain Skin: Negative for rash. Neurological: Negative for headaches, focal weakness or numbness. Psychiatric:No anxiety or depression.   Endocrine:No hot/cold intolerance, changes in energy, or sleep difficulty.  10-point ROS otherwise negative.  ____________________________________________   PHYSICAL EXAM:  VITAL SIGNS: ED Triage Vitals  Enc Vitals Group     BP 10/16/14 1549 151/54 mmHg     Pulse Rate 10/16/14 1549 80     Resp 10/16/14 1549 20     Temp 10/16/14 1549 98.4 F (36.9 C)     Temp Source 10/16/14 1549 Oral     SpO2 10/16/14 1549 98 %     Weight 10/16/14 1549 105 lb (47.628 kg)     Height 10/16/14 1549 5\' 2"  (1.575 m)     Head Cir --      Peak Flow --      Pain Score --      Pain Loc --      Pain Edu? --      Excl. in [place]? --      Constitutional: Alert and oriented. Well appearing and in no distress. Eyes: No scleral icterus. No conjunctival pallor. PERRL. EOMI ENT   Head: Normocephalic and atraumatic.   Nose: No congestion/rhinnorhea. No septal hematoma   Mouth/Throat: MMM, no pharyngeal  erythema. No peritonsillar mass. No uvula shift.   Neck: No stridor. No SubQ emphysema. No meningismus. Hematological/Lymphatic/Immunilogical: No cervical lymphadenopathy. Cardiovascular: RRR. Normal and symmetric distal pulses are present in all extremities. No murmurs, rubs, or gallops. Respiratory: Normal respiratory effort without tachypnea nor retractions. Breath sounds are clear and equal bilaterally. No wheezes/rales/rhonchi. Gastrointestinal: Soft and nontender. No distention. There is no CVA tenderness.  No rebound, rigidity, or guarding. Genitourinary: deferred Musculoskeletal: Nontender with normal range of motion in all extremities. No joint effusions.  No lower extremity tenderness.  No edema.  Left hip unremarkable. Pelvis stable. No midline spinal tenderness. Neurologic:   Normal speech and language.  CN 2-10 normal. Motor grossly intact. No pronator drift.  Normal gait. No gross focal neurologic deficits are appreciated.  Skin:  Skin is warm, dry and intact. No rash noted.  No petechiae, purpura, or bullae. Psychiatric: Mood and affect are normal. Speech and behavior are normal. Patient exhibits appropriate insight and judgment.  ____________________________________________    LABS (pertinent positives/negatives) (all labs ordered are listed, but only abnormal results are displayed) Labs Reviewed - No data to display ____________________________________________   EKG    ____________________________________________    RADIOLOGY  X-ray left hip unremarkable  ____________________________________________   PROCEDURES  ____________________________________________   INITIAL IMPRESSION / ASSESSMENT AND PLAN / ED COURSE  Pertinent labs & imaging results that were available during my care of the patient were reviewed by me and considered in my medical decision making (see chart for details).  Patient presents with acute on chronic left hip pain. Examination is unremarkable. There is no evidence of focal weakness or spinal injury. Low suspicion of stroke appear labs as cauda equina or epidural hematoma. No evidence of fracture dislocation or soft tissue infection. Patient will be discharged home to follow up with orthopedics for continued management of her chronic left hip pain. She will continue to take tramadol and Xanax as prescribed previously by her doctors, and I also recommended taking Tylenol as needed on top of this.  ____________________________________________   FINAL CLINICAL IMPRESSION(S) / ED DIAGNOSES  Final diagnoses:  Acute hip pain, left      Carrie Mew, MD 10/16/14 1714

## 2014-10-16 NOTE — ED Notes (Signed)
Has had chronic left hip pain, now pain has increased, more weaker

## 2014-11-07 ENCOUNTER — Telehealth: Payer: Self-pay | Admitting: *Deleted

## 2014-11-07 DIAGNOSIS — E278 Other specified disorders of adrenal gland: Secondary | ICD-10-CM

## 2014-11-07 MED ORDER — ALPRAZOLAM 0.25 MG PO TABS: 0.2500 mg | ORAL_TABLET | Freq: Every evening | ORAL | Status: DC | PRN

## 2014-11-07 NOTE — Telephone Encounter (Signed)
Rx for alprazolam faxed to [facility].

## 2014-11-08 ENCOUNTER — Other Ambulatory Visit: Payer: Self-pay | Admitting: *Deleted

## 2014-11-08 DIAGNOSIS — E278 Other specified disorders of adrenal gland: Secondary | ICD-10-CM

## 2014-12-05 ENCOUNTER — Telehealth: Payer: Self-pay | Admitting: *Deleted

## 2014-12-05 NOTE — Telephone Encounter (Signed)
Called and let them know that she just got rf on 7/26 for # 30 and 3 refills, They confirmed that they have rx on file

## 2015-01-18 ENCOUNTER — Ambulatory Visit: Payer: Medicare Other | Admitting: Oncology

## 2015-01-18 ENCOUNTER — Other Ambulatory Visit: Payer: Medicare Other

## 2015-01-30 ENCOUNTER — Other Ambulatory Visit: Payer: Medicare Other

## 2015-01-30 ENCOUNTER — Ambulatory Visit: Payer: Medicare Other | Admitting: Oncology

## 2015-02-07 ENCOUNTER — Inpatient Hospital Stay: Payer: Medicare Other | Attending: Oncology

## 2015-02-07 DIAGNOSIS — I10 Essential (primary) hypertension: Secondary | ICD-10-CM | POA: Insufficient documentation

## 2015-02-07 DIAGNOSIS — R5383 Other fatigue: Secondary | ICD-10-CM | POA: Insufficient documentation

## 2015-02-07 DIAGNOSIS — Z79899 Other long term (current) drug therapy: Secondary | ICD-10-CM | POA: Insufficient documentation

## 2015-02-07 DIAGNOSIS — M25552 Pain in left hip: Secondary | ICD-10-CM | POA: Insufficient documentation

## 2015-02-07 DIAGNOSIS — R531 Weakness: Secondary | ICD-10-CM | POA: Insufficient documentation

## 2015-02-07 DIAGNOSIS — R109 Unspecified abdominal pain: Secondary | ICD-10-CM | POA: Insufficient documentation

## 2015-02-07 DIAGNOSIS — Z800 Family history of malignant neoplasm of digestive organs: Secondary | ICD-10-CM | POA: Insufficient documentation

## 2015-02-07 DIAGNOSIS — R1902 Left upper quadrant abdominal swelling, mass and lump: Secondary | ICD-10-CM | POA: Insufficient documentation

## 2015-02-09 ENCOUNTER — Other Ambulatory Visit: Payer: Self-pay | Admitting: *Deleted

## 2015-02-09 DIAGNOSIS — E278 Other specified disorders of adrenal gland: Secondary | ICD-10-CM

## 2015-02-12 ENCOUNTER — Inpatient Hospital Stay: Payer: Medicare Other

## 2015-02-12 ENCOUNTER — Inpatient Hospital Stay (HOSPITAL_BASED_OUTPATIENT_CLINIC_OR_DEPARTMENT_OTHER): Payer: Medicare Other | Admitting: Oncology

## 2015-02-12 ENCOUNTER — Encounter: Payer: Self-pay | Admitting: Oncology

## 2015-02-12 VITALS — BP 180/84 | HR 75 | Temp 97.0°F | Wt 102.0 lb

## 2015-02-12 DIAGNOSIS — R531 Weakness: Secondary | ICD-10-CM | POA: Diagnosis not present

## 2015-02-12 DIAGNOSIS — C439 Malignant melanoma of skin, unspecified: Secondary | ICD-10-CM | POA: Insufficient documentation

## 2015-02-12 DIAGNOSIS — R109 Unspecified abdominal pain: Secondary | ICD-10-CM

## 2015-02-12 DIAGNOSIS — D649 Anemia, unspecified: Secondary | ICD-10-CM | POA: Insufficient documentation

## 2015-02-12 DIAGNOSIS — M25552 Pain in left hip: Secondary | ICD-10-CM | POA: Diagnosis not present

## 2015-02-12 DIAGNOSIS — Z800 Family history of malignant neoplasm of digestive organs: Secondary | ICD-10-CM | POA: Diagnosis not present

## 2015-02-12 DIAGNOSIS — Z79899 Other long term (current) drug therapy: Secondary | ICD-10-CM

## 2015-02-12 DIAGNOSIS — R1902 Left upper quadrant abdominal swelling, mass and lump: Secondary | ICD-10-CM

## 2015-02-12 DIAGNOSIS — I10 Essential (primary) hypertension: Secondary | ICD-10-CM | POA: Insufficient documentation

## 2015-02-12 DIAGNOSIS — K219 Gastro-esophageal reflux disease without esophagitis: Secondary | ICD-10-CM | POA: Insufficient documentation

## 2015-02-12 DIAGNOSIS — R5383 Other fatigue: Secondary | ICD-10-CM | POA: Diagnosis not present

## 2015-02-12 DIAGNOSIS — M810 Age-related osteoporosis without current pathological fracture: Secondary | ICD-10-CM | POA: Insufficient documentation

## 2015-02-12 DIAGNOSIS — J302 Other seasonal allergic rhinitis: Secondary | ICD-10-CM | POA: Insufficient documentation

## 2015-02-12 DIAGNOSIS — E278 Other specified disorders of adrenal gland: Secondary | ICD-10-CM | POA: Insufficient documentation

## 2015-02-12 DIAGNOSIS — M1990 Unspecified osteoarthritis, unspecified site: Secondary | ICD-10-CM | POA: Insufficient documentation

## 2015-02-12 LAB — CBC WITH DIFFERENTIAL/PLATELET
Basophils Absolute: 0.1 10*3/uL (ref 0–0.1)
Basophils Relative: 1 %
Eosinophils Absolute: 0.1 10*3/uL (ref 0–0.7)
Eosinophils Relative: 1 %
HEMATOCRIT: 36.1 % (ref 35.0–47.0)
HEMOGLOBIN: 12 g/dL (ref 12.0–16.0)
LYMPHS PCT: 16 %
Lymphs Abs: 1.8 10*3/uL (ref 1.0–3.6)
MCH: 35.5 pg — ABNORMAL HIGH (ref 26.0–34.0)
MCHC: 33.3 g/dL (ref 32.0–36.0)
MCV: 106.7 fL — AB (ref 80.0–100.0)
MONOS PCT: 7 %
Monocytes Absolute: 0.7 10*3/uL (ref 0.2–0.9)
NEUTROS ABS: 8.4 10*3/uL — AB (ref 1.4–6.5)
NEUTROS PCT: 75 %
Platelets: 221 10*3/uL (ref 150–440)
RBC: 3.39 MIL/uL — ABNORMAL LOW (ref 3.80–5.20)
RDW: 15.5 % — ABNORMAL HIGH (ref 11.5–14.5)
WBC: 11.1 10*3/uL — ABNORMAL HIGH (ref 3.6–11.0)

## 2015-02-12 LAB — COMPREHENSIVE METABOLIC PANEL
ALK PHOS: 70 U/L (ref 38–126)
ALT: 16 U/L (ref 14–54)
AST: 25 U/L (ref 15–41)
Albumin: 4.4 g/dL (ref 3.5–5.0)
Anion gap: 6 (ref 5–15)
BILIRUBIN TOTAL: 0.6 mg/dL (ref 0.3–1.2)
BUN: 35 mg/dL — AB (ref 6–20)
CALCIUM: 8.5 mg/dL — AB (ref 8.9–10.3)
CO2: 27 mmol/L (ref 22–32)
Chloride: 100 mmol/L — ABNORMAL LOW (ref 101–111)
Creatinine, Ser: 1.4 mg/dL — ABNORMAL HIGH (ref 0.44–1.00)
GFR calc Af Amer: 36 mL/min — ABNORMAL LOW (ref >60)
GFR, EST NON AFRICAN AMERICAN: 31 mL/min — AB (ref >60)
GLUCOSE: 131 mg/dL — AB (ref 65–99)
POTASSIUM: 4.6 mmol/L (ref 3.5–5.1)
Sodium: 133 mmol/L — ABNORMAL LOW (ref 135–145)
TOTAL PROTEIN: 6.5 g/dL (ref 6.5–8.1)

## 2015-02-12 NOTE — Progress Notes (Signed)
[place] @ [facility] Telephone:([phone]  Fax:([phone]) [phone]     CARALINA NOP OB: 29-Jun-1917  MR#: [id number]  OFB#:[id number]  Patient Care Team: Adin Hector, MD as PCP - General (Internal Medicine)  CHIEF COMPLAINT:  Chief Complaint  Patient presents with  . OTHER     No history exists.  Subjective: Chief Complaint/Diagnosis:   1. Abnormal CT scan dated July of 27, 2012.  A large mass in the left upper quadrant of abdomen.  Either arising from adrenal gland or the kidney.  Solid mass of left adnexal region. Biopsy is negative for any malignant cells (only blood) HPI:      INTERVAL HISTORY:  81 year old lady who is being followed because of ovarian as well as adrenal gland mass over.  Of many years. Patient's pain is well controlled with 2 tablets of tramadol and Tylenol in between Patient also had a left hip pain for which patient was in the emergency room in July had x-ray done revealed only arthritis patient is getting regularly steroid injection in left hip bicoronal clinic medicine Department Patient has moved to assisted living facility. Here for further follow-up and treatment consideration REVIEW OF SYSTEMS:    general status: Patient is feeling weak and tired.  No change in a performance status.  No chills.  No fever. HEENT: Alopecia.  No evidence of stomatitis Lungs: No cough or shortness of breath Cardiac: No chest pain or paroxysmal nocturnal dyspnea GI: No nausea no vomiting no diarrhea no abdominal pain Skin: No rash Lower extremity no swelling Neurological system: No tingling.  No numbness.  No other focal signs Musculoskeletal system no bony pains  As per HPI. Otherwise, a complete review of systems is negatve.  PAST MEDICAL HISTORY: Past Medical History  Diagnosis Date  . Ovarian cyst   . Hypertension    Smoking History: Smoking History Never Smoked.(1)  PFSH: Comments: history of colon cancer in the family  Comments: patient  is now draining.  Does not smoke  Additional Past Medical and Surgical History: hypercholesteremia, colonoscopy in April of 2005, history of hypertension   ADVANCED DIRECTIVES:  Patient does have advance healthcare directive, Patient   does not desire to make any changes HEALTH MAINTENANCE: Social History  Substance Use Topics  . Smoking status: Never Smoker   . Smokeless tobacco: None  . Alcohol Use: No      Allergies  Allergen Reactions  . Ace Inhibitors Cough  . Bisphosphonates Other (See Comments)    GERD  . Esomeprazole Magnesium Diarrhea    Current Outpatient Prescriptions  Medication Sig Dispense Refill  . ALPRAZolam (XANAX) 0.25 MG tablet Take 1 tablet (0.25 mg total) by mouth at bedtime as needed. 30 tablet 3  . amLODipine (NORVASC) 10 MG tablet TAKE 1 TABLET BY MOUTH ONCE DAILY.    . fluticasone (FLONASE) 50 MCG/ACT nasal spray INSTILL 2 SPRAYS IN EACH NOSTRIL ONCE DAILY AS NEEDED.    Marland Kitchen telmisartan (MICARDIS) 80 MG tablet TAKE 1 TABLET BY MOUTH ONCE DAILY    . traMADol (ULTRAM) 50 MG tablet Take 1 tablet (50 mg total) by mouth 4 (four) times daily as needed. 270 tablet 2  . Cholecalciferol (VITAMIN D-1000 MAX ST) 1000 UNITS tablet Take by mouth.    . Cyanocobalamin (RA VITAMIN B-12 TR) 1000 MCG TBCR Take by mouth.    . Multiple Vitamin (MULTI-VITAMINS) TABS Take by mouth.    . zoledronic acid (RECLAST) 5 MG/100ML SOLN injection Inject into the vein.  No current facility-administered medications for this visit.    OBJECTIVE:  Filed Vitals:   02/12/15 1603  BP: 180/84  Pulse: 75  Temp: 97 F (36.1 C)     Body mass index is 18.65 kg/(m^2).    ECOG FS:2 - Symptomatic, <50% confined to bed  General  status: Performance status is good.  Patient has not lost significant weight. Since last evaluation there is no significant change in the general status HEENT: No evidence of stomatitis. Sclera and conjunctivae :: No jaundice.   pale looking. Lungs: Air  entry  equal on both sides.  No rhonchi.  No rales.  Cardiac: Heart sounds are normal.  No pericardial rub.  No murmur. Lymphatic system: Cervical, axillary, inguinal, lymph nodes not palpable GI: Abdomen is soft.liver and spleen not palpable.  No ascites.  Bowel sounds are normal.  No other palpable masses.  No tenderness .  Vaguely palpable mass in the left lower quadrant of abdomen Lower extremity: No edema Neurological system: Higher functions, cranial nerves intact no evidence of peripheral neuropathy. Skin: No rash.  No ecchymosis.. No petechial hemorrhages   LAB RESULTS:  CBC Latest Ref Rng 02/12/2015 07/20/2014  WBC 3.6 - 11.0 K/uL 11.1(H) 10.9  Hemoglobin 12.0 - 16.0 g/dL 12.0 11.1(L)  Hematocrit 35.0 - 47.0 % 36.1 33.0(L)  Platelets 150 - 440 K/uL 221 174    Appointment on 02/12/2015  Component Date Value Ref Range Status  . WBC 02/12/2015 11.1* 3.6 - 11.0 K/uL Final  . RBC 02/12/2015 3.39* 3.80 - 5.20 MIL/uL Final  . Hemoglobin 02/12/2015 12.0  12.0 - 16.0 g/dL Final  . HCT 02/12/2015 36.1  35.0 - 47.0 % Final  . MCV 02/12/2015 106.7* 80.0 - 100.0 fL Final  . MCH 02/12/2015 35.5* 26.0 - 34.0 pg Final  . MCHC 02/12/2015 33.3  32.0 - 36.0 g/dL Final  . RDW 02/12/2015 15.5* 11.5 - 14.5 % Final  . Platelets 02/12/2015 221  150 - 440 K/uL Final  . Neutrophils Relative % 02/12/2015 75   Final  . Neutro Abs 02/12/2015 8.4* 1.4 - 6.5 K/uL Final  . Lymphocytes Relative 02/12/2015 16   Final  . Lymphs Abs 02/12/2015 1.8  1.0 - 3.6 K/uL Final  . Monocytes Relative 02/12/2015 7   Final  . Monocytes Absolute 02/12/2015 0.7  0.2 - 0.9 K/uL Final  . Eosinophils Relative 02/12/2015 1   Final  . Eosinophils Absolute 02/12/2015 0.1  0 - 0.7 K/uL Final  . Basophils Relative 02/12/2015 1   Final  . Basophils Absolute 02/12/2015 0.1  0 - 0.1 K/uL Final       STUDIES: No results found.  ASSESSMENT: Impression:   1. Abnormal CT scan with abdominal mass. Multiple biopsies are negative.   Patient has increasing abdominal pain.  Ultrasound reveals persistent and unchanged adnexal mass.  We discussed again one more time changing medication to long-acting fentanyl patch with a small dose of 12 g but patient does not want to start any of those medication.  Going to increase tramadol to 4 tablets a day and see whethe  MEDICAL DECISION MAKING:  Lab data has been reviewed there is stable Continue with 2 tablets of tramadol and 2 tablets of Tylenol Patient is going to get a flu vaccine by assisted living facility Left hip pain is better after  Steroid injection Abdominal mass has remained stable over the years of many years as well as multiple biopsies were negative for any malignancy I did not  feel like any further scanning would be needed Patient now can be followed by primary care physician. She has an appointment to see Dr. Caryl Comes in 2 weeks she will be discharged from my care and will be happy to see her again if there is any question arises about abdominal mass   Patient expressed understanding and was in agreement with this plan. She also understands that She can call clinic at any time with any questions, concerns, or complaints.    No matching staging information was found for the patient.  Forest Gleason, MD   02/12/2015 4:06 PM

## 2015-06-25 ENCOUNTER — Other Ambulatory Visit: Payer: Self-pay | Admitting: *Deleted

## 2015-06-25 NOTE — Telephone Encounter (Signed)
Discharged form practice

## 2017-09-22 ENCOUNTER — Other Ambulatory Visit: Payer: Self-pay | Admitting: Internal Medicine

## 2017-09-22 ENCOUNTER — Ambulatory Visit
Admission: RE | Admit: 2017-09-22 | Discharge: 2017-09-22 | Disposition: A | Discharge status: Home / Self Care | Payer: Medicare Other | Source: Ambulatory Visit | Attending: Internal Medicine | Admitting: Internal Medicine

## 2017-09-22 DIAGNOSIS — R600 Localized edema: Secondary | ICD-10-CM

## 2017-10-18 ENCOUNTER — Inpatient Hospital Stay: Payer: Medicare Other

## 2017-10-18 ENCOUNTER — Other Ambulatory Visit: Payer: Self-pay

## 2017-10-18 ENCOUNTER — Inpatient Hospital Stay
Admission: EM | Admit: 2017-10-18 | Discharge: 2017-10-20 | DRG: 378 | Disposition: A | Discharge status: Nursing Home (Includes ICF) | Payer: Medicare Other | Source: Skilled Nursing Facility | Attending: Specialist | Admitting: Specialist

## 2017-10-18 DIAGNOSIS — D62 Acute posthemorrhagic anemia: Secondary | ICD-10-CM | POA: Diagnosis present

## 2017-10-18 DIAGNOSIS — Z66 Do not resuscitate: Secondary | ICD-10-CM | POA: Diagnosis present

## 2017-10-18 DIAGNOSIS — K921 Melena: Secondary | ICD-10-CM | POA: Diagnosis present

## 2017-10-18 DIAGNOSIS — I10 Essential (primary) hypertension: Secondary | ICD-10-CM | POA: Diagnosis present

## 2017-10-18 DIAGNOSIS — Z7951 Long term (current) use of inhaled steroids: Secondary | ICD-10-CM

## 2017-10-18 DIAGNOSIS — F419 Anxiety disorder, unspecified: Secondary | ICD-10-CM | POA: Diagnosis present

## 2017-10-18 DIAGNOSIS — Z888 Allergy status to other drugs, medicaments and biological substances status: Secondary | ICD-10-CM

## 2017-10-18 DIAGNOSIS — M810 Age-related osteoporosis without current pathological fracture: Secondary | ICD-10-CM | POA: Diagnosis present

## 2017-10-18 DIAGNOSIS — K922 Gastrointestinal hemorrhage, unspecified: Secondary | ICD-10-CM | POA: Diagnosis present

## 2017-10-18 DIAGNOSIS — F0390 Unspecified dementia without behavioral disturbance: Secondary | ICD-10-CM | POA: Diagnosis present

## 2017-10-18 DIAGNOSIS — Z7982 Long term (current) use of aspirin: Secondary | ICD-10-CM

## 2017-10-18 DIAGNOSIS — Z79899 Other long term (current) drug therapy: Secondary | ICD-10-CM

## 2017-10-18 DIAGNOSIS — D539 Nutritional anemia, unspecified: Secondary | ICD-10-CM | POA: Diagnosis present

## 2017-10-18 DIAGNOSIS — Z8249 Family history of ischemic heart disease and other diseases of the circulatory system: Secondary | ICD-10-CM

## 2017-10-18 DIAGNOSIS — R1110 Vomiting, unspecified: Secondary | ICD-10-CM | POA: Diagnosis present

## 2017-10-18 DIAGNOSIS — R609 Edema, unspecified: Secondary | ICD-10-CM

## 2017-10-18 LAB — CBC
HEMATOCRIT: 18 % — AB (ref 35.0–47.0)
HEMOGLOBIN: 6 g/dL — AB (ref 12.0–16.0)
MCH: 38 pg — ABNORMAL HIGH (ref 26.0–34.0)
MCHC: 33.3 g/dL (ref 32.0–36.0)
MCV: 114.2 fL — ABNORMAL HIGH (ref 80.0–100.0)
Platelets: 182 10*3/uL (ref 150–440)
RBC: 1.58 MIL/uL — ABNORMAL LOW (ref 3.80–5.20)
RDW: 15.4 % — ABNORMAL HIGH (ref 11.5–14.5)
WBC: 15.1 10*3/uL — ABNORMAL HIGH (ref 3.6–11.0)

## 2017-10-18 LAB — COMPREHENSIVE METABOLIC PANEL
ALT: 12 U/L (ref 0–44)
AST: 18 U/L (ref 15–41)
Albumin: 2.8 g/dL — ABNORMAL LOW (ref 3.5–5.0)
Alkaline Phosphatase: 43 U/L (ref 38–126)
Anion gap: 7 (ref 5–15)
BILIRUBIN TOTAL: 0.6 mg/dL (ref 0.3–1.2)
BUN: 70 mg/dL — AB (ref 8–23)
CHLORIDE: 103 mmol/L (ref 98–111)
CO2: 26 mmol/L (ref 22–32)
CREATININE: 1.04 mg/dL — AB (ref 0.44–1.00)
Calcium: 8.3 mg/dL — ABNORMAL LOW (ref 8.9–10.3)
GFR calc Af Amer: 50 mL/min — ABNORMAL LOW (ref >60)
GFR calc non Af Amer: 44 mL/min — ABNORMAL LOW (ref >60)
Glucose, Bld: 153 mg/dL — ABNORMAL HIGH (ref 70–99)
Potassium: 4.2 mmol/L (ref 3.5–5.1)
Sodium: 136 mmol/L (ref 135–145)
Total Protein: 4.6 g/dL — ABNORMAL LOW (ref 6.5–8.1)

## 2017-10-18 LAB — ABO/RH: ABO/RH(D): B POS

## 2017-10-18 LAB — HEMOGLOBIN: HEMOGLOBIN: 7.5 g/dL — AB (ref 12.0–16.0)

## 2017-10-18 LAB — TSH: TSH: 2.477 u[IU]/mL (ref 0.350–4.500)

## 2017-10-18 LAB — TROPONIN I: Troponin I: <0.03 ng/mL (ref <0.03)

## 2017-10-18 LAB — APTT

## 2017-10-18 LAB — IRON AND TIBC
IRON: 60 ug/dL (ref 28–170)
Saturation Ratios: 27 % (ref 10.4–31.8)
TIBC: 222 ug/dL — AB (ref 250–450)
UIBC: 162 ug/dL

## 2017-10-18 LAB — MRSA PCR SCREENING: MRSA by PCR: NEGATIVE

## 2017-10-18 LAB — PROTIME-INR
INR: 1.08
PROTHROMBIN TIME: 13.9 s (ref 11.4–15.2)

## 2017-10-18 MED ORDER — DOCUSATE SODIUM 100 MG PO CAPS: 100.0000 mg | ORAL_CAPSULE | Freq: Two times a day (BID) | ORAL | Status: DC | PRN

## 2017-10-18 MED ORDER — FAMOTIDINE IN NACL 20-0.9 MG/50ML-% IV SOLN
20.0000 mg | Freq: Two times a day (BID) | INTRAVENOUS | Status: DC
  Administered 2017-10-18 – 2017-10-19 (×3): 20 mg via INTRAVENOUS
  Filled 2017-10-18 (×3): qty 50

## 2017-10-18 MED ORDER — PANTOPRAZOLE SODIUM 40 MG PO TBEC: 40.0000 mg | DELAYED_RELEASE_TABLET | Freq: Two times a day (BID) | ORAL | Status: DC

## 2017-10-18 MED ORDER — SODIUM CHLORIDE 0.9 % IV SOLN
8.0000 mg/h | INTRAVENOUS | Status: DC
  Administered 2017-10-18 – 2017-10-19 (×3): 8 mg/h via INTRAVENOUS
  Filled 2017-10-18 (×3): qty 80

## 2017-10-18 MED ORDER — PANTOPRAZOLE SODIUM 40 MG IV SOLR: 40.0000 mg | Freq: Two times a day (BID) | INTRAVENOUS | Status: DC

## 2017-10-18 MED ORDER — VITAMIN B-12 1000 MCG PO TABS
1000.0000 ug | ORAL_TABLET | Freq: Every day | ORAL | Status: DC
  Administered 2017-10-18 – 2017-10-20 (×3): 1000 ug via ORAL
  Filled 2017-10-18 (×3): qty 1

## 2017-10-18 MED ORDER — SODIUM CHLORIDE 0.9 % IV SOLN
80.0000 mg | Freq: Once | INTRAVENOUS | Status: AC
  Administered 2017-10-18: 80 mg via INTRAVENOUS
  Filled 2017-10-18: qty 80

## 2017-10-18 MED ORDER — ALPRAZOLAM 0.25 MG PO TABS
0.2500 mg | ORAL_TABLET | Freq: Every day | ORAL | Status: DC
  Administered 2017-10-18 – 2017-10-19 (×2): 0.25 mg via ORAL
  Filled 2017-10-18 (×2): qty 1

## 2017-10-18 MED ORDER — ONDANSETRON HCL 4 MG/2ML IJ SOLN
4.0000 mg | Freq: Once | INTRAMUSCULAR | Status: AC
  Administered 2017-10-18: 4 mg via INTRAVENOUS
  Filled 2017-10-18: qty 2

## 2017-10-18 MED ORDER — ACETAMINOPHEN 325 MG PO TABS: 325.0000 mg | ORAL_TABLET | Freq: Four times a day (QID) | ORAL | Status: DC | PRN

## 2017-10-18 MED ORDER — ONDANSETRON HCL 4 MG/2ML IJ SOLN: 4.0000 mg | Freq: Four times a day (QID) | INTRAMUSCULAR | Status: DC | PRN

## 2017-10-18 MED ORDER — CALCIUM CARBONATE ANTACID 500 MG PO CHEW
500.0000 mg | CHEWABLE_TABLET | Freq: Every day | ORAL | Status: DC
  Administered 2017-10-20: 500 mg via ORAL
  Filled 2017-10-18: qty 3

## 2017-10-18 MED ORDER — ADULT MULTIVITAMIN W/MINERALS CH
1.0000 | ORAL_TABLET | Freq: Every day | ORAL | Status: DC
  Administered 2017-10-19 – 2017-10-20 (×2): 1 via ORAL
  Filled 2017-10-18 (×2): qty 1

## 2017-10-18 MED ORDER — SODIUM CHLORIDE 0.9% IV SOLUTION
Freq: Once | INTRAVENOUS | Status: AC
  Administered 2017-10-18: 13:00:00 via INTRAVENOUS
  Filled 2017-10-18: qty 250

## 2017-10-18 NOTE — NC FL2 (Addendum)
[place] LEVEL OF CARE SCREENING TOOL     IDENTIFICATION  Patient Name: Karen Bartlett Birthdate: 13-Mar-1918 Sex: female Admission Date (Current Location): 10/18/2017  [facility] and [place] Number:  Engineering geologist and Address:  [facility], [address] [place],  [address]      Provider Number: [id number]  Attending Physician Name and Address:  Vaughan Basta, *  Relative Name and Phone Number:       Current Level of Care: Hospital Recommended Level of Care: [place] Prior Approval Number:    Date Approved/Denied:   PASRR Number:    Discharge Plan: Domiciliary (Rest home)    Current Diagnoses: Patient Active Problem List   Diagnosis Date Noted  . GI bleed 10/18/2017  . Anemia due to blood loss, acute 10/18/2017  . Absolute anemia 02/12/2015  . Arthritis, degenerative 02/12/2015  . Acid reflux 02/12/2015  . BP (high blood pressure) 02/12/2015  . Adrenal mass ([place]) 02/12/2015  . Malignant melanoma ([place]) 02/12/2015  . OP (osteoporosis) 02/12/2015  . Allergic rhinitis, seasonal 02/12/2015  . Amnesia 04/05/2014    Orientation RESPIRATION BLADDER Height & Weight     Self, Time, Situation, Place  Normal Incontinent Weight: 102 lb (46.3 kg) Height:  5\' 2"  (157.5 cm)  BEHAVIORAL SYMPTOMS/MOOD NEUROLOGICAL BOWEL NUTRITION STATUS      Incontinent Diet(normal)  AMBULATORY STATUS COMMUNICATION OF NEEDS Skin   Independent Verbally Normal                       Personal Care Assistance Level of Assistance  Bathing, Feeding, Dressing, Total care Bathing: Supervision Eating: Supervision Dressing: Supervision Care: Supervision   Functional Limitations Info  Sight, Hearing, Speech Sight Info: Adequate Hearing Info: Adequate(Hearing aids) Speech Info: Adequate    SPECIAL CARE FACTORS FREQUENCY                       Contractures Contractures Info: Not present     Additional Factors Info  Code Status, Allergies Code Status Info: full Allergies Info: Ace inhibitors, bisophonates, Esomprozal           Allergies as of 10/20/2017      Reactions   Ace Inhibitors Cough   Bisphosphonates Other (See Comments)   GERD   Esomeprazole Magnesium Diarrhea         Medication List    STOP taking these medications   aspirin EC 81 MG tablet     TAKE these medications   ALPRAZolam 0.25 MG tablet Commonly known as:  XANAX Take 0.25 mg by mouth at bedtime. (and 1 tablet daily as needed for anxiety)   amLODipine 5 MG tablet Commonly known as:  NORVASC Take 5 mg by mouth daily.   calcium carbonate 1250 (500 Ca) MG tablet Commonly known as:  OS-CAL - dosed in mg of elemental calcium Take 1 tablet by mouth.   fluticasone 50 MCG/ACT nasal spray Commonly known as:  FLONASE INSTILL 2 SPRAYS IN EACH NOSTRIL ONCE DAILY AS NEEDED.   GNP NATURAL FIBER 28.3 % Powd Generic drug:  Psyllium Mix 2 teaspoonful in fluid and take by mouth at bedtime   MULTI-VITAMINS Tabs Take 1 tablet by mouth daily.   pantoprazole 40 MG tablet Commonly known as:  PROTONIX Take 1 tablet (40 mg total) by mouth daily.   PEG 3350 Pack Take 17 g by mouth daily as needed (constipation).   PHARBETOL 325 MG tablet Generic  drug:  acetaminophen Take 325 mg by mouth every 6 (six) hours as needed for mild pain or moderate pain.   PRESERVISION AREDS Caps Take 1 capsule by mouth daily.   PREVAGEN 10 MG Caps Generic drug:  Apoaequorin Take 10 mg by mouth every morning.   RA VITAMIN B-12 TR 1000 MCG Tbcr Generic drug:  Cyanocobalamin Take 1,000 mcg by mouth daily.   sodium chloride 0.65 % Soln nasal spray Commonly known as:  OCEAN Place 1 spray into both nostrils as needed for congestion.   telmisartan 80 MG tablet Commonly known as:  MICARDIS TAKE 1 TABLET BY MOUTH ONCE DAILY   traMADol 50 MG tablet Commonly known as:  ULTRAM Take 50 mg by  mouth at bedtime.   VITAMIN D-1000 MAX ST 1000 units tablet Generic drug:  Cholecalciferol Take 1,000 Units by mouth daily.   vitamin E 1000 UNIT capsule Take 1,000 Units by mouth daily.         Additional Information [id number]  Shela Leff MSW,LCSW

## 2017-10-18 NOTE — Progress Notes (Signed)
Family Meeting Note  Advance Directive:yes  Today a meeting took place with the son.  Patient is unable to participate due [id number] capacity Old age, some dementia   The following clinical team members were present during this meeting:MD  The following were discussed:Patient's diagnosis: GI bleed, anemia , Patient's progosis: Unable to determine and Goals for treatment: DNR  Additional follow-up to be provided: GI  Time spent during discussion:20 minutes  Vaughan Basta, MD

## 2017-10-18 NOTE — Clinical Social Work Note (Signed)
Clinical Social Work Assessment  Patient Details  Name: Karen Bartlett MRN: [id number] Date of Birth: 1917/11/02  Date of referral:  10/18/17               Reason for consult:  Other (Comment Required)(From [facility])                Permission sought to share information with:  Family Supports Permission granted to share information::  Yes, Verbal Permission Granted  Name::     Karen Bartlett [facility] [phone]  Agency::  [facility]  Relationship::     Contact Information:     Housing/Transportation Living arrangements for the past 2 months:  [place] of Information:  Patient, Medical Team, Adult Children Patient Interpreter Needed:  None Criminal Activity/Legal Involvement Pertinent to Current Situation/Hospitalization:  No - Comment as needed Significant Relationships:  Adult Children, Other Family Members, Friend Lives with:  Facility Resident Do you feel safe going back to the place where you live?  Yes Need for family participation in patient care:  Yes (Comment)  Care giving concerns: Family member stated she is very pale and complaining of being cold- LCSW brought warm blanket   Social Worker assessment / plan: LCSW introduced myself to family member and patient and obtained verbal consent to speak to friend to complete assessment. Patient is a 84 year old female who came in to hospital weak and vomiting this morning. She lives at [facility] and will [facility] back to [facility] once better. She is very independent with her ADLs usually and walks with her cane. She usually able to care for herself dressing and hygiene minimum support however she reports she was unable to get out of bed today and feels very weak. Patient is widowed and has a son Karen Bartlett HCPOA her insurance is [employer]. Patient has hearing aid and glasses and is verbal for the most part and oriented x4  Employment status:  Psychiatric nurse) Insurance information:  Medicare, Managed Medicare([employer]) PT Recommendations:  Not assessed at this time Information / Referral to community resources:     Patient/Family's Response to care: She understands she is feeling weak  Patient/Family's Understanding of and Emotional Response to Diagnosis, Current Treatment, and Prognosis:  Good understanding of her situation  Emotional Assessment Appearance:  Appears younger than stated age Attitude/Demeanor/Rapport:  Engaged, Charismatic Affect (typically observed):  Accepting, Stable Orientation:  Oriented to Self, Oriented to Place, Oriented to  Time, Oriented to Situation Alcohol / Substance use:  Not Applicable Psych involvement (Current and /or in the community):  No (Comment)  Discharge Needs  Concerns to be addressed:  No discharge needs identified Readmission within the last 30 days:  No Current discharge risk:  None Barriers to Discharge:  Continued Medical Work up   [place], LCSW 10/18/2017, 10:52 AM

## 2017-10-18 NOTE — ED Provider Notes (Signed)
[facility] Emergency Department Provider Note   ____________________________________________    I have reviewed the triage vital signs and the nursing notes.   HISTORY  Chief Complaint Emesis     HPI Karen Bartlett is a 84 y.o. female who presents after an episode of nausea and vomiting this morning.  Patient states she thinks it was more nausea than vomiting.  She denies abdominal pain.  She feels like she has to have a stool now.  No reports of fevers.  Patient otherwise has no complaints.  Felt well yesterday.  No sick contacts reported, has not taken anything for this  Past Medical History:  Diagnosis Date  . Hypertension   . Ovarian cyst     Patient Active Problem List   Diagnosis Date Noted  . Absolute anemia 02/12/2015  . Arthritis, degenerative 02/12/2015  . Acid reflux 02/12/2015  . BP (high blood pressure) 02/12/2015  . Adrenal mass ([place]) 02/12/2015  . Malignant melanoma ([place]) 02/12/2015  . OP (osteoporosis) 02/12/2015  . Allergic rhinitis, seasonal 02/12/2015  . Amnesia 04/05/2014    History reviewed. No pertinent surgical history.  Prior to Admission medications   Medication Sig Start Date End Date Taking? Authorizing Provider  acetaminophen (PHARBETOL) 325 MG tablet Take 325 mg by mouth every 6 (six) hours as needed for mild pain or moderate pain.  01/15/16  Yes [provider]  ALPRAZolam (XANAX) 0.25 MG tablet Take 0.25 mg by mouth at bedtime. (and 1 tablet daily as needed for anxiety)   Yes [provider]  amLODipine (NORVASC) 5 MG tablet Take 5 mg by mouth daily.   Yes [provider]  Apoaequorin (PREVAGEN) 10 MG CAPS Take 10 mg by mouth every morning.   Yes [provider]  aspirin EC 81 MG tablet Take 81 mg by mouth 2 (two) times a week.   Yes [provider]  calcium carbonate (OS-CAL - DOSED IN MG OF ELEMENTAL CALCIUM) 1250 (500 Ca) MG tablet Take 1 tablet by mouth.    Yes [provider]  Cholecalciferol (VITAMIN D-1000 MAX ST) 1000 UNITS tablet Take 1,000 Units by mouth daily.    Yes [provider]  Cyanocobalamin (RA VITAMIN B-12 TR) 1000 MCG TBCR Take 1,000 mcg by mouth daily.    Yes [provider]  Multiple Vitamin (MULTI-VITAMINS) TABS Take 1 tablet by mouth daily.    Yes [provider]  Multiple Vitamins-Minerals (PRESERVISION AREDS) CAPS Take 1 capsule by mouth daily.   Yes [provider]  Polyethylene Glycol 3350 (PEG 3350) PACK Take 17 g by mouth daily as needed (constipation).  05/13/17  Yes [provider]  Psyllium (GNP NATURAL FIBER) 28.3 % POWD Mix 2 teaspoonful in fluid and take by mouth at bedtime 07/20/17  Yes [provider]  sodium chloride (OCEAN) 0.65 % SOLN nasal spray Place 1 spray into both nostrils as needed for congestion.   Yes [provider]  telmisartan (MICARDIS) 80 MG tablet TAKE 1 TABLET BY MOUTH ONCE DAILY 11/28/13  Yes [provider]  traMADol (ULTRAM) 50 MG tablet Take 50 mg by mouth at bedtime.   Yes [provider]  vitamin E 1000 UNIT capsule Take 1,000 Units by mouth daily.   Yes [provider]  fluticasone (FLONASE) 50 MCG/ACT nasal spray INSTILL 2 SPRAYS IN EACH NOSTRIL ONCE DAILY AS NEEDED. 03/22/14   [provider]     Allergies Ace inhibitors; Bisphosphonates; and Esomeprazole magnesium  No family history on file.  Social History Social History   Tobacco Use  . Smoking status: Never Smoker  Substance Use Topics  . Alcohol use: No  . Drug use: Not on file    Review of Systems  Constitutional: No fever/chills Eyes: No visual changes.  ENT: No neck pain Cardiovascular: Denies chest pain. Respiratory: Denies shortness of breath. Gastrointestinal: As above Genitourinary: Negative for dysuria. Musculoskeletal: Negative for back pain. Skin: Negative for rash. Neurological: Negative for headaches     ____________________________________________   PHYSICAL EXAM:  VITAL SIGNS: ED Triage Vitals [10/18/17 0737]  Enc Vitals Group     BP 109/72     Pulse Rate 72     Resp 19     Temp (!) 97 F (36.1 C)     Temp Source Oral     SpO2 100 %     Weight 46.3 kg (102 lb)     Height 1.575 m (5\' 2" )     Head Circumference      Peak Flow      Pain Score 0     Pain Loc      Pain Edu?      Excl. in [place]?     Constitutional: Alert and oriented.  Eyes: Conjunctivae are normal.  Head: Atraumatic. Nose: No congestion/rhinnorhea. Mouth/Throat: Mucous membranes are moist.    Cardiovascular: Normal rate, regular rhythm.  Good peripheral circulation. Respiratory: Normal respiratory effort.  No retractions. Lungs CTAB. Gastrointestinal: Soft and nontender. No distention.  Black stool, guaiac positive  Musculoskeletal: Left leg with significant edema, right leg normal warm and well perfused Neurologic:  Normal speech and language. No gross focal neurologic deficits are appreciated.  Skin:  Skin is warm, dry and intact. No rash noted. Psychiatric: Mood and affect are normal. Speech and behavior are normal.  ____________________________________________   LABS (all labs ordered are listed, but only abnormal results are displayed)  Labs Reviewed  CBC - Abnormal; Notable for the following components:      Result Value   WBC 15.1 (*)    RBC 1.58 (*)    Hemoglobin 6.0 (*)    HCT 18.0 (*)    MCV 114.2 (*)    MCH 38.0 (*)    RDW 15.4 (*)    All other components within normal limits  COMPREHENSIVE METABOLIC PANEL - Abnormal; Notable for the following components:   Glucose, Bld 153 (*)    BUN 70 (*)    Creatinine, Ser 1.04 (*)    Calcium 8.3 (*)    Total Protein 4.6 (*)    Albumin 2.8 (*)    GFR calc non Af Amer 44 (*)    GFR calc Af Amer 50 (*)    All other components within normal limits  TROPONIN I  PROTIME-INR  APTT  TYPE AND SCREEN  PREPARE RBC (CROSSMATCH)  ABO/RH    ____________________________________________  EKG  ED ECG REPORT I, Lavonia Drafts, the attending physician, personally viewed and interpreted this ECG.  Date: 10/18/2017  Rhythm: normal sinus rhythm QRS Axis: normal Intervals: normal ST/T Wave abnormalities: Nonspecific changes   ____________________________________________  RADIOLOGY  None ____________________________________________   PROCEDURES  Procedure(s) performed: No  Procedures   Critical Care performed: yes  CRITICAL CARE Performed by: Lavonia Drafts   Total critical care time: 30 minutes  Critical care time was exclusive of separately billable procedures and treating other patients.  Critical care was necessary to treat or prevent imminent or life-threatening deterioration.  Critical  care was time spent personally by me on the following activities: development of treatment plan with patient and/or surrogate as well as nursing, discussions with consultants, evaluation of patient's response to treatment, examination of patient, obtaining history from patient or surrogate, ordering and performing treatments and interventions, ordering and review of laboratory studies, ordering and review of radiographic studies, pulse oximetry and re-evaluation of patient's condition.  ____________________________________________   INITIAL IMPRESSION / ASSESSMENT AND PLAN / ED COURSE  Pertinent labs & imaging results that were available during my care of the patient were reviewed by me and considered in my medical decision making (see chart for details).  Patient presents with episode of nausea and vomiting now feels like she may have diarrhea.  No abdominal pain no tenderness palpation.  Exam is reassuring.  Suspect viral gastroenteritis, will check labs give IV fluids, IV Zofran  Stool is melanotic, concerning for GI bleed we will add type and screen  Hemoglobin is 6 down from a baseline of 12-13.  I have ordered 1  unit packed red blood cells for transfusion now  Discussed with hospitalist for admission, discussed with family    ____________________________________________   FINAL CLINICAL IMPRESSION(S) / ED DIAGNOSES  Final diagnoses:  Gastrointestinal hemorrhage, unspecified gastrointestinal hemorrhage type        Note:  This document was prepared using Dragon voice recognition software and may include unintentional dictation errors.    Lavonia Drafts, MD 10/18/17 [phone]

## 2017-10-18 NOTE — ED Notes (Signed)
Admitting EDP at bedside at this time.

## 2017-10-18 NOTE — ED Notes (Signed)
.   Pt is resting, Respirations even and unlabored, NAD. Stretcher lowest postion and locked. Call bell within reach. Denies any needs at this time RN will continue to monitor.  Family at bedside.  

## 2017-10-18 NOTE — ED Notes (Signed)
Pt had BM that was solid, dark brown transitioned to black tarry stool. Upon peri-care bright red blood was noted. EDP was made aware Type and Screen was preformed.

## 2017-10-18 NOTE — H&P (Signed)
[facility] [place] NAME: Karen Bartlett    MR#:  [id number]  DATE OF BIRTH:  [birth year]  DATE OF ADMISSION:  10/18/2017  PRIMARY CARE PHYSICIAN: Adin Hector, MD   REQUESTING/REFERRING PHYSICIAN: Corky Downs  CHIEF COMPLAINT:   Chief Complaint  Patient presents with  . Emesis    HISTORY OF PRESENT ILLNESS: Karen Bartlett  is a 84 y.o. female with a known history of Htn- Lives at a group home, active and independent in day to day activities, some dementia. Started having vomiting dark coloured liquid today,so sent to ER. Last month in clinic her hb > 10, now it is 6, so ER ordered blood transfusion and requested to admit. She denies any OTC pain meds use, she is on ASA. No c/o GERD or bleed in past. Have swelling on left leg for last 2-3 days. No pain.  PAST MEDICAL HISTORY:   Past Medical History:  Diagnosis Date  . Hypertension   . Ovarian cyst     PAST SURGICAL HISTORY: History reviewed. No pertinent surgical history.  SOCIAL HISTORY:  Social History   Tobacco Use  . Smoking status: Never Smoker  Substance Use Topics  . Alcohol use: No    FAMILY HISTORY:  Family History  Problem Relation Age of Onset  . Hypertension Mother     DRUG ALLERGIES:  Allergies  Allergen Reactions  . Ace Inhibitors Cough  . Bisphosphonates Other (See Comments)    GERD  . Esomeprazole Magnesium Diarrhea    REVIEW OF SYSTEMS:   CONSTITUTIONAL: No fever, fatigue or weakness.  EYES: No blurred or double vision.  EARS, NOSE, AND THROAT: No tinnitus or ear pain.  RESPIRATORY: No cough, shortness of breath, wheezing or hemoptysis.  CARDIOVASCULAR: No chest pain, orthopnea, edema.  GASTROINTESTINAL: No nausea, vomiting, diarrhea or abdominal pain.  GENITOURINARY: No dysuria, hematuria.  ENDOCRINE: No polyuria, nocturia,  HEMATOLOGY: No anemia, easy bruising or bleeding SKIN: No rash or lesion. MUSCULOSKELETAL: No joint pain or  arthritis.   NEUROLOGIC: No tingling, numbness, weakness.  PSYCHIATRY: No anxiety or depression.   MEDICATIONS AT HOME:  Prior to Admission medications   Medication Sig Start Date End Date Taking? Authorizing Provider  acetaminophen (PHARBETOL) 325 MG tablet Take 325 mg by mouth every 6 (six) hours as needed for mild pain or moderate pain.  01/15/16  Yes [provider]  ALPRAZolam (XANAX) 0.25 MG tablet Take 0.25 mg by mouth at bedtime. (and 1 tablet daily as needed for anxiety)   Yes [provider]  amLODipine (NORVASC) 5 MG tablet Take 5 mg by mouth daily.   Yes [provider]  Apoaequorin (PREVAGEN) 10 MG CAPS Take 10 mg by mouth every morning.   Yes [provider]  aspirin EC 81 MG tablet Take 81 mg by mouth 2 (two) times a week.   Yes [provider]  calcium carbonate (OS-CAL - DOSED IN MG OF ELEMENTAL CALCIUM) 1250 (500 Ca) MG tablet Take 1 tablet by mouth.   Yes [provider]  Cholecalciferol (VITAMIN D-1000 MAX ST) 1000 UNITS tablet Take 1,000 Units by mouth daily.    Yes [provider]  Cyanocobalamin (RA VITAMIN B-12 TR) 1000 MCG TBCR Take 1,000 mcg by mouth daily.    Yes [provider]  Multiple Vitamin (MULTI-VITAMINS) TABS Take 1 tablet by mouth daily.    Yes [provider]  Multiple Vitamins-Minerals (PRESERVISION AREDS) CAPS Take 1 capsule by mouth  daily.   Yes [provider]  Polyethylene Glycol 3350 (PEG 3350) PACK Take 17 g by mouth daily as needed (constipation).  05/13/17  Yes [provider]  Psyllium (GNP NATURAL FIBER) 28.3 % POWD Mix 2 teaspoonful in fluid and take by mouth at bedtime 07/20/17  Yes [provider]  sodium chloride (OCEAN) 0.65 % SOLN nasal spray Place 1 spray into both nostrils as needed for congestion.   Yes [provider]  telmisartan (MICARDIS) 80 MG tablet TAKE 1 TABLET BY MOUTH ONCE DAILY 11/28/13  Yes [provider]   traMADol (ULTRAM) 50 MG tablet Take 50 mg by mouth at bedtime.   Yes [provider]  vitamin E 1000 UNIT capsule Take 1,000 Units by mouth daily.   Yes [provider]  fluticasone (FLONASE) 50 MCG/ACT nasal spray INSTILL 2 SPRAYS IN EACH NOSTRIL ONCE DAILY AS NEEDED. 03/22/14   [provider]      PHYSICAL EXAMINATION:   VITAL SIGNS: Blood pressure 109/72, pulse 72, temperature (!) 97 F (36.1 C), temperature source Oral, resp. rate 19, height 5\' 2"  (1.575 m), weight 46.3 kg (102 lb), SpO2 100 %.  GENERAL:  84 y.o.-year-old patient lying in the bed with no acute distress.  EYES: Pupils equal, round, reactive to light and accommodation. No scleral icterus. Extraocular muscles intact. Conjunctiva pale. HEENT: Head atraumatic, normocephalic. Oropharynx and nasopharynx clear.  NECK:  Supple, no jugular venous distention. No thyroid enlargement, no tenderness.  LUNGS: Normal breath sounds bilaterally, no wheezing, rales,rhonchi or crepitation. No use of accessory muscles of respiration.  CARDIOVASCULAR: S1, S2 normal. No murmurs, rubs, or gallops.  ABDOMEN: Soft, nontender, nondistended. Bowel sounds present. No organomegaly or mass.  EXTREMITIES: No pedal edema, cyanosis, or clubbing. Left leg is swollen, not much redness, have some superficial subcutaneous small bleed areas with darker skin on both legs( Age appropriate) NEUROLOGIC: Cranial nerves II through XII are intact. Muscle strength 4/5 in all extremities. Sensation intact. Gait not checked.  PSYCHIATRIC: The patient is alert and oriented x 2.  SKIN: No obvious rash, lesion, or ulcer.   LABORATORY PANEL:   CBC Recent Labs  Lab 10/18/17 0741  WBC 15.1*  HGB 6.0*  HCT 18.0*  PLT 182  MCV 114.2*  MCH 38.0*  MCHC 33.3  RDW 15.4*   ------------------------------------------------------------------------------------------------------------------  Chemistries  Recent Labs  Lab 10/18/17 0741  NA  136  K 4.2  CL 103  CO2 26  GLUCOSE 153*  BUN 70*  CREATININE 1.04*  CALCIUM 8.3*  AST 18  ALT 12  ALKPHOS 43  BILITOT 0.6   ------------------------------------------------------------------------------------------------------------------ estimated creatinine clearance is 22.6 mL/min (A) (by C-G formula based on SCr of 1.04 mg/dL (H)). ------------------------------------------------------------------------------------------------------------------ No results for input(s): TSH, T4TOTAL, T3FREE, THYROIDAB in the last 72 hours.  Invalid input(s): FREET3   Coagulation profile Recent Labs  Lab 10/18/17 0909  INR 1.08   ------------------------------------------------------------------------------------------------------------------- No results for input(s): DDIMER in the last 72 hours. -------------------------------------------------------------------------------------------------------------------  Cardiac Enzymes Recent Labs  Lab 10/18/17 0741  TROPONINI <0.03   ------------------------------------------------------------------------------------------------------------------ Invalid input(s): POCBNP  ---------------------------------------------------------------------------------------------------------------  Urinalysis No results found for: COLORURINE, APPEARANCEUR, LABSPEC, PHURINE, GLUCOSEU, HGBUR, BILIRUBINUR, KETONESUR, PROTEINUR, UROBILINOGEN, NITRITE, LEUKOCYTESUR   RADIOLOGY: No results found.  EKG: Orders placed or performed during the hospital encounter of 10/18/17  . EKG 12-Lead  . EKG 12-Lead    IMPRESSION AND PLAN:  * GI bleed   Liquid diet   Likely Upper GI   PPI BID  Serial Hb check   GI consult.  * Anemia due to blood loss   Macrocytic anemia   Blood transfusion ordered by ER   Check iron, Vit B12, TSH   Macrocytosis- chronically.   * Htn   Hold meds for now due to GI bleed and anemia.  * left leg swelling   Does not look  cellulitis this time   Check Venous doppler.  All the records are reviewed and case discussed with ED provider. Management plans discussed with the patient, family and they are in agreement.  CODE STATUS: Full. As per son's girl friend- " she is not sure, if pt is DNR  Or not. They have Some paperwork at group home. " I encouraged her to discuss with son and update the nurse as soon as possible.   TOTAL TIME TAKING CARE OF THIS PATIENT: 50 minutes.    Vaughan Basta M.D on 10/18/2017   Between 7am to 6pm - Pager - [phone]  After 6pm go to [email or web address] - password [email or web address]  Office  [phone]  CC: Primary care physician; Adin Hector, MD   Note: This dictation was prepared with Dragon dictation along with smaller phrase technology. Any transcriptional errors that result from this process are unintentional.

## 2017-10-18 NOTE — Progress Notes (Signed)
Pt son, Richardson Landry, at bedside. Paged MD to speak with family.

## 2017-10-18 NOTE — ED Triage Notes (Signed)
Pt arrives via [facility] from [facility] for 1 episode of vomiting. EMS reports they were told it was brown in color. VS were as followed BP109/71, HR 72, temp  97, vomiting [place]). Pt is a Full Code

## 2017-10-18 NOTE — Clinical Social Work Note (Signed)
CSW received consult that patient admitted from [facility]. The patient had already been assessed by the ED CSW. The CSW is following.  Santiago Bumpers, MSW, Latanya Presser [phone]

## 2017-10-18 NOTE — ED Notes (Signed)
.  Report was attempted at this time RN will continue to monitor.    

## 2017-10-19 LAB — BASIC METABOLIC PANEL
ANION GAP: 3 — AB (ref 5–15)
BUN: 47 mg/dL — AB (ref 8–23)
CO2: 28 mmol/L (ref 22–32)
Calcium: 8.3 mg/dL — ABNORMAL LOW (ref 8.9–10.3)
Chloride: 107 mmol/L (ref 98–111)
Creatinine, Ser: 0.98 mg/dL (ref 0.44–1.00)
GFR, EST AFRICAN AMERICAN: 54 mL/min — AB (ref >60)
GFR, EST NON AFRICAN AMERICAN: 47 mL/min — AB (ref >60)
Glucose, Bld: 96 mg/dL (ref 70–99)
Potassium: 4.1 mmol/L (ref 3.5–5.1)
SODIUM: 138 mmol/L (ref 135–145)

## 2017-10-19 LAB — CBC
HEMATOCRIT: 20.5 % — AB (ref 35.0–47.0)
Hemoglobin: 7.2 g/dL — ABNORMAL LOW (ref 12.0–16.0)
MCH: 36.4 pg — ABNORMAL HIGH (ref 26.0–34.0)
MCHC: 34.9 g/dL (ref 32.0–36.0)
MCV: 104.4 fL — ABNORMAL HIGH (ref 80.0–100.0)
PLATELETS: 129 10*3/uL — AB (ref 150–440)
RBC: 1.97 MIL/uL — AB (ref 3.80–5.20)
RDW: 19.2 % — AB (ref 11.5–14.5)
WBC: 7.7 10*3/uL (ref 3.6–11.0)

## 2017-10-19 LAB — PREPARE RBC (CROSSMATCH)

## 2017-10-19 LAB — VITAMIN B12: Vitamin B-12: 1936 pg/mL — ABNORMAL HIGH (ref 180–914)

## 2017-10-19 LAB — HEMOGLOBIN: Hemoglobin: 8 g/dL — ABNORMAL LOW (ref 12.0–16.0)

## 2017-10-19 LAB — FERRITIN: FERRITIN: 130 ng/mL (ref 11–307)

## 2017-10-19 MED ORDER — PANTOPRAZOLE SODIUM 40 MG IV SOLR
40.0000 mg | Freq: Two times a day (BID) | INTRAVENOUS | Status: DC
  Administered 2017-10-19 – 2017-10-20 (×3): 40 mg via INTRAVENOUS
  Filled 2017-10-19 (×3): qty 40

## 2017-10-19 MED ORDER — SODIUM CHLORIDE 0.9% IV SOLUTION: Freq: Once | INTRAVENOUS | Status: DC

## 2017-10-19 MED ORDER — MUPIROCIN CALCIUM 2 % EX CREA
TOPICAL_CREAM | Freq: Every day | CUTANEOUS | Status: DC
  Administered 2017-10-19 – 2017-10-20 (×2): via TOPICAL
  Filled 2017-10-19: qty 15

## 2017-10-19 NOTE — Progress Notes (Signed)
[facility] [place] NAME: Karen Bartlett    MR#:  [id number]  DATE OF BIRTH:  [birth year]  SUBJECTIVE:   Patient here due to coffee/dark emesis and noted to have a suspected upper GI bleed.  Patient's hemoglobin was as low as 6.0 but after transfusion is gone up to 8.  No further acute bleeding overnight.  Seen by gastroenterology and given her advanced age and no further symptoms no plans for endoscopic evaluation now.  REVIEW OF SYSTEMS:    Review of Systems  Constitutional: Negative for chills and fever.  HENT: Negative for congestion and tinnitus.   Eyes: Negative for blurred vision and double vision.  Respiratory: Negative for cough, shortness of breath and wheezing.   Cardiovascular: Negative for chest pain, orthopnea and PND.  Gastrointestinal: Negative for abdominal pain, diarrhea, nausea and vomiting.  Genitourinary: Negative for dysuria and hematuria.  Neurological: Negative for dizziness, sensory change and focal weakness.  All other systems reviewed and are negative.   Nutrition: Clear Liquids Tolerating Diet: yes Tolerating PT: Ambulatory     DRUG ALLERGIES:   Allergies  Allergen Reactions  . Ace Inhibitors Cough  . Bisphosphonates Other (See Comments)    GERD  . Esomeprazole Magnesium Diarrhea    VITALS:  Blood pressure (!) 161/74, pulse 77, temperature 98.2 F (36.8 C), temperature source Oral, resp. rate 18, height 5\' 2"  (1.575 m), weight 46.3 kg (102 lb), SpO2 98 %.  PHYSICAL EXAMINATION:   Physical Exam  GENERAL:  84 y.o.-year-old patient lying in bed in no acute distress.  EYES: Pupils equal, round, reactive to light and accommodation. No scleral icterus. Extraocular muscles intact.  HEENT: Head atraumatic, normocephalic. Oropharynx and nasopharynx clear.  NECK:  Supple, no jugular venous distention. No thyroid enlargement, no tenderness.  LUNGS: Normal breath sounds bilaterally, no wheezing, rales,  rhonchi. No use of accessory muscles of respiration.  CARDIOVASCULAR: S1, S2 normal. No murmurs, rubs, or gallops.  ABDOMEN: Soft, nontender, nondistended. Bowel sounds present. No organomegaly or mass.  EXTREMITIES: No cyanosis, clubbing or edema b/l.    NEUROLOGIC: Cranial nerves II through XII are intact. No focal Motor or sensory deficits b/l. Globally weak.    PSYCHIATRIC: The patient is alert and oriented x 3.  SKIN: No obvious rash, lesion, or ulcer.    LABORATORY PANEL:   CBC Recent Labs  Lab 10/19/17 0342 10/19/17 1105  WBC 7.7  --   HGB 7.2* 8.0*  HCT 20.5*  --   PLT 129*  --    ------------------------------------------------------------------------------------------------------------------  Chemistries  Recent Labs  Lab 10/18/17 0741 10/19/17 0342  NA 136 138  K 4.2 4.1  CL 103 107  CO2 26 28  GLUCOSE 153* 96  BUN 70* 47*  CREATININE 1.04* 0.98  CALCIUM 8.3* 8.3*  AST 18  --   ALT 12  --   ALKPHOS 43  --   BILITOT 0.6  --    ------------------------------------------------------------------------------------------------------------------  Cardiac Enzymes Recent Labs  Lab 10/18/17 0741  TROPONINI <0.03   ------------------------------------------------------------------------------------------------------------------  RADIOLOGY:  US Venous Img Lower Unilateral Left  Result Date: 10/18/2017 CLINICAL DATA:  Left lower extremity pain and edema. Evaluate for DVT. EXAM: LEFT LOWER EXTREMITY VENOUS DOPPLER ULTRASOUND TECHNIQUE: Gray-scale sonography with graded compression, as well as color Doppler and duplex ultrasound were performed to evaluate the lower extremity deep venous systems from the level of the common femoral vein and including the common femoral, femoral, profunda femoral, popliteal and calf  veins including the posterior tibial, peroneal and gastrocnemius veins when visible. The superficial great saphenous vein was also interrogated. Spectral  Doppler was utilized to evaluate flow at rest and with distal augmentation maneuvers in the common femoral, femoral and popliteal veins. COMPARISON:  Left lower extremity venous Doppler ultrasound - 09/22/2017 FINDINGS: Contralateral Common Femoral Vein: Respiratory phasicity is normal and symmetric with the symptomatic side. No evidence of thrombus. Normal compressibility. Common Femoral Vein: No evidence of thrombus. Normal compressibility, respiratory phasicity and response to augmentation. Saphenofemoral Junction: No evidence of thrombus. Normal compressibility and flow on color Doppler imaging. Profunda Femoral Vein: No evidence of thrombus. Normal compressibility and flow on color Doppler imaging. Femoral Vein: No evidence of thrombus. Normal compressibility, respiratory phasicity and response to augmentation. Popliteal Vein: No evidence of thrombus. Normal compressibility, respiratory phasicity and response to augmentation. Calf Veins: Not well visualized Superficial Great Saphenous Vein: No evidence of thrombus. Normal compressibility. Venous Reflux:  None. Other Findings: There is an approximately 2.7 x 1.7 x 1.1 cm serpiginous anechoic fluid collection with the left popliteal fossa compatible with a Baker cyst. A moderate amount of subcutaneous edema is noted at the level of the left calf. IMPRESSION: 1. No evidence of DVT within the left lower extremity. 2. Incidentally noted approximately 2.7 cm left-sided Baker's cyst. 3. Moderate amount of subcutaneous edema noted at the level the left calf. Electronically Signed   By: Sandi Mariscal M.D.   On: 10/18/2017 14:02     ASSESSMENT AND PLAN:   84 year old lady with past medical history of essential hypertension, osteoporosis, anxiety who presents to the hospital due to coffee/dark emesis and suspected to have an upper GI bleed.  1.  GI bleed-suspected to be upper GI bleed given the patient's coffee-ground emesis and low hemoglobin. - Patient has been  transfused 2 units of packed red blood cells hemoglobin improved and currently stable at 8.  Admission hemoglobin was low at 6. -Seen by gastroenterology and given patient's advanced age in no acute bleeding no plans for endoscopic evaluation presently.  Patient's family is in agreement.  Will change from Protonix drip to PPI IV twice daily. -Continue clear liquid diet and advance as tolerated.  2.  Anemia-this is a macrocytic anemia. -Patient's B12 and Folate are normal. Transfused and Hg. Improved and currently stable.   3.  Essential hypertension-hold antihypertensives in the setting of anemia and GI bleeding.  4.  Anxiety-continue Xanax.  5.  Osteoporosis-continue calcium and vitamin D supplements.     All the records are reviewed and case discussed with Care Management/Social Worker. Management plans discussed with the patient, family and they are in agreement.  CODE STATUS: DNR  DVT Prophylaxis: Ted's & SCD's.   TOTAL TIME TAKING CARE OF THIS PATIENT: 30 minutes.   POSSIBLE D/C IN 1-2 DAYS, DEPENDING ON CLINICAL CONDITION.   Henreitta Leber M.D on 10/19/2017 at 3:28 PM  Between 7am to 6pm - Pager - [phone]  After 6pm go to [email or web address] - Proofreader  [facility]  Office  [phone]  CC: Primary care physician; Adin Hector, MD

## 2017-10-19 NOTE — Clinical Social Work Note (Signed)
CSW spoke with Horris Latino at [facility] and she can take patient back when ready. CSW will continue to follow. Shela Leff MSW,LCSW [phone]

## 2017-10-19 NOTE — Progress Notes (Signed)
Phone call from son Richardson Landry and he wishes for his mom not to have the EGD at this time and would like a phone call from the MD to discuss further.

## 2017-10-19 NOTE — Consult Note (Signed)
[facility] Nurse wound consult note Reason for Consult:Nonhealing lesion to left posterior lower leg.  Patient states she cares for this herself.   Wound type: Unclear etiology. Pressure Injury POA: NA Measurement: 3 cm x 2.4 cm x 0.3 cm  Wound bed: pale pink nongranulating visible tendon at 9 o'clock in wound bed.  Drainage (amount, consistency, odor) minimal serosanguinous  No odor Periwound:intact  Bruising to bilateral lower legs Dressing procedure/placement/frequency: Cleanse wound to left posterior lower leg with NS.  Apply mupirocin ointment to wound bed.  Cover with silicone foam dressing. Change daily.  Will not follow at this time.  Please re-consult if needed.  Domenic Moras RN BSN [place] Pager [phone]

## 2017-10-19 NOTE — Consult Note (Signed)
[facility] GI Inpatient Consult Note   Karen Bartlett, M.D.  Reason for Consult: GI bleed   Attending Requesting Consult: DR. Marthann Bartlett   History of Present Illness: Karen Bartlett is a 84 y.o. female with reported melena, brown emesis last PM. Has a baseline Hgb around 10 but yesterday was 6. Does not reportedly take blood thinners or have a known history of ulcer disease. Patient is confused and unable to give a history. I spoke with the son last evening after patient admission and he has reported to nursing staff today that the family wishes no procedures to be performed invasively unless the patient is actively hemorrhaging. The patient per RN report has not had any recurrent bleeding since hospital admission. The patient appears alert and in no distress.  Past Medical History:  Past Medical History:  Diagnosis Date  . Hypertension   . Ovarian cyst     Problem List: Patient Active Problem List   Diagnosis Date Noted  . GI bleed 10/18/2017  . Anemia due to blood loss, acute 10/18/2017  . Absolute anemia 02/12/2015  . Arthritis, degenerative 02/12/2015  . Acid reflux 02/12/2015  . BP (high blood pressure) 02/12/2015  . Adrenal mass ([place]) 02/12/2015  . Malignant melanoma ([place]) 02/12/2015  . OP (osteoporosis) 02/12/2015  . Allergic rhinitis, seasonal 02/12/2015  . Amnesia 04/05/2014    Past Surgical History: History reviewed. No pertinent surgical history.  Allergies: Allergies  Allergen Reactions  . Ace Inhibitors Cough  . Bisphosphonates Other (See Comments)    GERD  . Esomeprazole Magnesium Diarrhea    Home Medications: Medications Prior to Admission  Medication Sig Dispense Refill Last Dose  . acetaminophen (PHARBETOL) 325 MG tablet Take 325 mg by mouth every 6 (six) hours as needed for mild pain or moderate pain.    10/17/2017 at PRN  . ALPRAZolam (XANAX) 0.25 MG tablet Take 0.25 mg by mouth at bedtime. (and 1 tablet daily as needed for anxiety)    10/17/2017 at 2100  . amLODipine (NORVASC) 5 MG tablet Take 5 mg by mouth daily.   10/17/2017 at 0800  . Apoaequorin (PREVAGEN) 10 MG CAPS Take 10 mg by mouth every morning.   10/17/2017 at 0900  . aspirin EC 81 MG tablet Take 81 mg by mouth 2 (two) times a week.   10/16/2017 at 0800  . calcium carbonate (OS-CAL - DOSED IN MG OF ELEMENTAL CALCIUM) 1250 (500 Ca) MG tablet Take 1 tablet by mouth.   10/17/2017 at 0900  . Cholecalciferol (VITAMIN D-1000 MAX ST) 1000 UNITS tablet Take 1,000 Units by mouth daily.    10/17/2017 at 1200  . Cyanocobalamin (RA VITAMIN B-12 TR) 1000 MCG TBCR Take 1,000 mcg by mouth daily.    10/17/2017 at 1200  . fluticasone (FLONASE) 50 MCG/ACT nasal spray INSTILL 2 SPRAYS IN EACH NOSTRIL ONCE DAILY AS NEEDED.   10/17/2017 at Unknown time  . Multiple Vitamin (MULTI-VITAMINS) TABS Take 1 tablet by mouth daily.    10/17/2017 at 1200  . Multiple Vitamins-Minerals (PRESERVISION AREDS) CAPS Take 1 capsule by mouth daily.   10/17/2017 at 1200  . Polyethylene Glycol 3350 (PEG 3350) PACK Take 17 g by mouth daily as needed (constipation).    PRN at PRN  . Psyllium (GNP NATURAL FIBER) 28.3 % POWD Mix 2 teaspoonful in fluid and take by mouth at bedtime   10/17/2017 at 2100  . sodium chloride (OCEAN) 0.65 % SOLN nasal spray Place 1 spray into both nostrils as needed  for congestion.   PRN at PRN  . telmisartan (MICARDIS) 80 MG tablet TAKE 1 TABLET BY MOUTH ONCE DAILY   10/17/2017 at 0900  . traMADol (ULTRAM) 50 MG tablet Take 50 mg by mouth at bedtime.   10/17/2017 at 2100  . vitamin E 1000 UNIT capsule Take 1,000 Units by mouth daily.   10/17/2017 at 1200   Home medication reconciliation was completed with the patient.   Scheduled Inpatient Medications:   . ALPRAZolam  0.25 mg Oral QHS  . calcium carbonate  500 mg of elemental calcium Oral Q breakfast  . multivitamin with minerals  1 tablet Oral Daily  . [START ON 10/22/2017] pantoprazole  40 mg Intravenous Q12H  . vitamin B-12  1,000 mcg Oral Daily     Continuous Inpatient Infusions:   . famotidine (PEPCID) IV Stopped (10/18/17 2209)  . pantoprozole (PROTONIX) infusion 8 mg/hr (10/19/17 0223)    PRN Inpatient Medications:  acetaminophen, docusate sodium, ondansetron (ZOFRAN) IV  Family History: family history includes Hypertension in her mother.   GI Family History: negative  Social History:   reports that she has never smoked. She has never used smokeless tobacco. She reports that she does not drink alcohol. The patient denies ETOH, tobacco, or drug use.    Review of Systems: Review of Systems - largely unobtainable given patient's confused state  Physical Examination: BP (!) 162/70 (BP Location: Left Arm)   Pulse 75   Temp 98.5 F (36.9 C) (Oral)   Resp 16   Ht 5\' 2"  (1.575 m)   Wt 46.3 kg (102 lb)   SpO2 99%   BMI 18.66 kg/m  Physical Exam  Data: Lab Results  Component Value Date   WBC 7.7 10/19/2017   HGB 7.2 (L) 10/19/2017   HCT 20.5 (L) 10/19/2017   MCV 104.4 (H) 10/19/2017   PLT 129 (L) 10/19/2017   Recent Labs  Lab 10/18/17 0741 10/18/17 1818 10/19/17 0342  HGB 6.0* 7.5* 7.2*   Lab Results  Component Value Date   NA 138 10/19/2017   K 4.1 10/19/2017   CL 107 10/19/2017   CO2 28 10/19/2017   BUN 47 (H) 10/19/2017   CREATININE 0.98 10/19/2017   Lab Results  Component Value Date   ALT 12 10/18/2017   AST 18 10/18/2017   ALKPHOS 43 10/18/2017   BILITOT 0.6 10/18/2017   Recent Labs  Lab 10/18/17 0909  APTT <24*  INR 1.08   CBC Latest Ref Rng & Units 10/19/2017 10/18/2017 10/18/2017  WBC 3.6 - 11.0 K/uL 7.7 - 15.1(H)  Hemoglobin 12.0 - 16.0 g/dL 7.2(L) 7.5(L) 6.0(L)  Hematocrit 35.0 - 47.0 % 20.5(L) - 18.0(L)  Platelets 150 - 440 K/uL 129(L) - 182    STUDIES: US Venous Img Lower Unilateral Left  Result Date: 10/18/2017 CLINICAL DATA:  Left lower extremity pain and edema. Evaluate for DVT. EXAM: LEFT LOWER EXTREMITY VENOUS DOPPLER ULTRASOUND TECHNIQUE: Gray-scale sonography with  graded compression, as well as color Doppler and duplex ultrasound were performed to evaluate the lower extremity deep venous systems from the level of the common femoral vein and including the common femoral, femoral, profunda femoral, popliteal and calf veins including the posterior tibial, peroneal and gastrocnemius veins when visible. The superficial great saphenous vein was also interrogated. Spectral Doppler was utilized to evaluate flow at rest and with distal augmentation maneuvers in the common femoral, femoral and popliteal veins. COMPARISON:  Left lower extremity venous Doppler ultrasound - 09/22/2017 FINDINGS: Contralateral Common Femoral Vein:  Respiratory phasicity is normal and symmetric with the symptomatic side. No evidence of thrombus. Normal compressibility. Common Femoral Vein: No evidence of thrombus. Normal compressibility, respiratory phasicity and response to augmentation. Saphenofemoral Junction: No evidence of thrombus. Normal compressibility and flow on color Doppler imaging. Profunda Femoral Vein: No evidence of thrombus. Normal compressibility and flow on color Doppler imaging. Femoral Vein: No evidence of thrombus. Normal compressibility, respiratory phasicity and response to augmentation. Popliteal Vein: No evidence of thrombus. Normal compressibility, respiratory phasicity and response to augmentation. Calf Veins: Not well visualized Superficial Great Saphenous Vein: No evidence of thrombus. Normal compressibility. Venous Reflux:  None. Other Findings: There is an approximately 2.7 x 1.7 x 1.1 cm serpiginous anechoic fluid collection with the left popliteal fossa compatible with a Baker cyst. A moderate amount of subcutaneous edema is noted at the level of the left calf. IMPRESSION: 1. No evidence of DVT within the left lower extremity. 2. Incidentally noted approximately 2.7 cm left-sided Baker's cyst. 3. Moderate amount of subcutaneous edema noted at the level the left calf.  Electronically Signed   By: Sandi Mariscal M.D.   On: 10/18/2017 14:02   @IMAGES @  Assessment:1. Melena-likely related to peptic ulcer disease versus erosive gastritis. Cannot rule out the potential malignancy. Hemoglobin increased to 7.2 after blood transfusions.  Recommendations: 1. Continue observation.continue IV Protonix and switch to by mouth as available. 2. I will be available by phone if there is any change in patient's clinical status. 3. Begin clear liquid diet. Advance as tolerated if observation continues to be unremarkable. 4. Call back if I can help.  Thank you for the consult. Please call with questions or concerns.  Karen Bartlett, "Karen Hurst MD [facility] [place], [place] [address] [phone]  10/19/2017 7:49 AM

## 2017-10-20 LAB — CBC
HEMATOCRIT: 23.7 % — AB (ref 35.0–47.0)
Hemoglobin: 8.1 g/dL — ABNORMAL LOW (ref 12.0–16.0)
MCH: 36 pg — AB (ref 26.0–34.0)
MCHC: 34.3 g/dL (ref 32.0–36.0)
MCV: 105 fL — AB (ref 80.0–100.0)
PLATELETS: 183 10*3/uL (ref 150–440)
RBC: 2.25 MIL/uL — ABNORMAL LOW (ref 3.80–5.20)
RDW: 18.8 % — ABNORMAL HIGH (ref 11.5–14.5)
WBC: 11.2 10*3/uL — AB (ref 3.6–11.0)

## 2017-10-20 MED ORDER — OCUVITE-LUTEIN PO CAPS
1.0000 | ORAL_CAPSULE | Freq: Every day | ORAL | Status: DC
  Filled 2017-10-20: qty 1

## 2017-10-20 MED ORDER — PANTOPRAZOLE SODIUM 40 MG PO TBEC: 40.0000 mg | DELAYED_RELEASE_TABLET | Freq: Every day | ORAL | Status: DC

## 2017-10-20 MED ORDER — ENSURE ENLIVE PO LIQD: 237.0000 mL | Freq: Two times a day (BID) | ORAL | Status: DC

## 2017-10-20 NOTE — Progress Notes (Addendum)
Discharge order received. Patient is alert and oriented x 1 Vital signs stable . No signs of acute distress. Discharge instructions given to son and he  verbalized understanding. No other issues noted at this time.

## 2017-10-20 NOTE — Clinical Social Work Note (Signed)
Patient discharging today back to [facility]. CSW has spoken to [place] at [facility] and she is aware. Discharge information sent to [facility]. CSW spoke with patient's son and he will provide transportation. Shela Leff MSW,LCSW [phone]

## 2017-10-20 NOTE — Discharge Summary (Signed)
Karen Bartlett at [place] NAME: Karen Bartlett    MR#:  [id number]  DATE OF BIRTH:  08-11-1917  DATE OF ADMISSION:  10/18/2017 ADMITTING PHYSICIAN: Vaughan Basta, MD  DATE OF DISCHARGE: No discharge date for patient encounter.  PRIMARY CARE PHYSICIAN: Tama High III, MD    ADMISSION DIAGNOSIS:  Swelling [R60.9] Gastrointestinal hemorrhage, unspecified gastrointestinal hemorrhage type [K92.2]  DISCHARGE DIAGNOSIS:  Principal Problem:   GI bleed Active Problems:   Anemia due to blood loss, acute   SECONDARY DIAGNOSIS:   Past Medical History:  Diagnosis Date  . Hypertension   . Ovarian cyst     HOSPITAL COURSE:   85 year old lady with past medical history of essential hypertension, osteoporosis, anxiety who presents to the hospital due to coffee/dark emesis and suspected to have an upper GI bleed.  1.  GI bleed- sent to the hospital due to coffee-ground emesis and worsening anemia.  This was thought to be an upper GI bleed. - Patient was transfused a total of 2units of packed red blood cells patient's hemoglobin has improved posttransfusion and is currently stable.  Patient has had no further hematemesis or coffee-ground emesis. -A gastroenterology consult was obtained and given patient's advanced age and no further acute bleeding endoscopic evaluation was not done and patient's son was in agreement. -Patient was placed initially on a Protonix drip, switch to PPI twice daily and now is being discharged on oral Protonix.   -Patient was advised to avoid NSAIDs and she takes those for headaches.  Patient to use Tylenol for now.  She has been taken off the aspirin for now.  2.  Anemia-this was a macrocytic anemia. -Patient's B12 and Folate are normal. Transfused and Hg. Improved and currently stable.   3.  Essential hypertension- patient will resume her antihypertensives upon discharge as her bleeding has stopped and her  hemoglobin remained stable.  4.  Anxiety- she will continue Xanax.  5.  Osteoporosis- she will continue calcium and vitamin D supplements    DISCHARGE CONDITIONS:   Stable  CONSULTS OBTAINED:  Treatment Team:  Efrain Sella, MD  DRUG ALLERGIES:   Allergies  Allergen Reactions  . Ace Inhibitors Cough  . Bisphosphonates Other (See Comments)    GERD  . Esomeprazole Magnesium Diarrhea    DISCHARGE MEDICATIONS:   Allergies as of 10/20/2017      Reactions   Ace Inhibitors Cough   Bisphosphonates Other (See Comments)   GERD   Esomeprazole Magnesium Diarrhea      Medication List    STOP taking these medications   aspirin EC 81 MG tablet     TAKE these medications   ALPRAZolam 0.25 MG tablet Commonly known as:  XANAX Take 0.25 mg by mouth at bedtime. (and 1 tablet daily as needed for anxiety)   amLODipine 5 MG tablet Commonly known as:  NORVASC Take 5 mg by mouth daily.   calcium carbonate 1250 (500 Ca) MG tablet Commonly known as:  OS-CAL - dosed in mg of elemental calcium Take 1 tablet by mouth.   fluticasone 50 MCG/ACT nasal spray Commonly known as:  FLONASE INSTILL 2 SPRAYS IN EACH NOSTRIL ONCE DAILY AS NEEDED.   GNP NATURAL FIBER 28.3 % Powd Generic drug:  Psyllium Mix 2 teaspoonful in fluid and take by mouth at bedtime   MULTI-VITAMINS Tabs Take 1 tablet by mouth daily.   pantoprazole 40 MG tablet Commonly known as:  PROTONIX Take 1 tablet (40 mg  total) by mouth daily.   PEG 3350 Pack Take 17 g by mouth daily as needed (constipation).   PHARBETOL 325 MG tablet Generic drug:  acetaminophen Take 325 mg by mouth every 6 (six) hours as needed for mild pain or moderate pain.   PRESERVISION AREDS Caps Take 1 capsule by mouth daily.   PREVAGEN 10 MG Caps Generic drug:  Apoaequorin Take 10 mg by mouth every morning.   RA VITAMIN B-12 TR 1000 MCG Tbcr Generic drug:  Cyanocobalamin Take 1,000 mcg by mouth daily.   sodium chloride 0.65 %  Soln nasal spray Commonly known as:  OCEAN Place 1 spray into both nostrils as needed for congestion.   telmisartan 80 MG tablet Commonly known as:  MICARDIS TAKE 1 TABLET BY MOUTH ONCE DAILY   traMADol 50 MG tablet Commonly known as:  ULTRAM Take 50 mg by mouth at bedtime.   VITAMIN D-1000 MAX ST 1000 units tablet Generic drug:  Cholecalciferol Take 1,000 Units by mouth daily.   vitamin E 1000 UNIT capsule Take 1,000 Units by mouth daily.         DISCHARGE INSTRUCTIONS:   DIET:  Cardiac diet  DISCHARGE CONDITION:  Stable  ACTIVITY:  Activity as tolerated  OXYGEN:  Home Oxygen: No.   Oxygen Delivery: room air  DISCHARGE LOCATION:  Assisted Living.   If you experience worsening of your admission symptoms, develop shortness of breath, life threatening emergency, suicidal or homicidal thoughts you must seek medical attention immediately by calling 911 or calling your MD immediately  if symptoms less severe.  You Must read complete instructions/literature along with all the possible adverse reactions/side effects for all the Medicines you take and that have been prescribed to you. Take any new Medicines after you have completely understood and accpet all the possible adverse reactions/side effects.   Please note  You were cared for by a hospitalist during your hospital stay. If you have any questions about your discharge medications or the care you received while you were in the hospital after you are discharged, you can call the unit and asked to speak with the hospitalist on call if the hospitalist that took care of you is not available. Once you are discharged, your primary care physician will handle any further medical issues. Please note that NO REFILLS for any discharge medications will be authorized once you are discharged, as it is imperative that you return to your primary care physician (or establish a relationship with a primary care physician if you do not  have one) for your aftercare needs so that they can reassess your need for medications and monitor your lab values.     Today   No further bleeding overnight.  Hemoglobin remained stable.  Will discharge back to skilled nursing facility today.  Patient has been taken off the aspirin.  VITAL SIGNS:  Blood pressure (!) 179/85, pulse 77, temperature 97.9 F (36.6 C), resp. rate (!) 24, height 5\' 2"  (1.575 m), weight 46.3 kg (102 lb), SpO2 98 %.  I/O:    Intake/Output Summary (Last 24 hours) at 10/20/2017 1133 Last data filed at 10/20/2017 0518 Gross per 24 hour  Intake 537 ml  Output 3500 ml  Net -2963 ml    PHYSICAL EXAMINATION:   GENERAL:  84 y.o.-year-old patient lying in bed in no acute distress.  EYES: Pupils equal, round, reactive to light and accommodation. No scleral icterus. Extraocular muscles intact.  HEENT: Head atraumatic, normocephalic. Oropharynx and nasopharynx clear.  NECK:  Supple, no jugular venous distention. No thyroid enlargement, no tenderness.  LUNGS: Normal breath sounds bilaterally, no wheezing, rales, rhonchi. No use of accessory muscles of respiration.  CARDIOVASCULAR: S1, S2 normal. No murmurs, rubs, or gallops.  ABDOMEN: Soft, nontender, nondistended. Bowel sounds present. No organomegaly or mass.  EXTREMITIES: No cyanosis, clubbing or edema b/l.    NEUROLOGIC: Cranial nerves II through XII are intact. No focal Motor or sensory deficits b/l. Globally weak.    PSYCHIATRIC: The patient is alert and oriented x 3.  SKIN: No obvious rash, lesion, or ulcer.   DATA REVIEW:   CBC Recent Labs  Lab 10/20/17 0938  WBC 11.2*  HGB 8.1*  HCT 23.7*  PLT 183    Chemistries  Recent Labs  Lab 10/18/17 0741 10/19/17 0342  NA 136 138  K 4.2 4.1  CL 103 107  CO2 26 28  GLUCOSE 153* 96  BUN 70* 47*  CREATININE 1.04* 0.98  CALCIUM 8.3* 8.3*  AST 18  --   ALT 12  --   ALKPHOS 43  --   BILITOT 0.6  --     Cardiac Enzymes Recent Labs  Lab  10/18/17 0741  TROPONINI <0.03    Microbiology Results  Results for orders placed or performed during the hospital encounter of 10/18/17  MRSA PCR Screening     Status: None   Collection Time: 10/18/17 12:41 PM  Result Value Ref Range Status   MRSA by PCR NEGATIVE NEGATIVE Final    Comment:        The GeneXpert MRSA Assay (FDA approved for NASAL specimens only), is one component of a comprehensive MRSA colonization surveillance program. It is not intended to diagnose MRSA infection nor to guide or monitor treatment for MRSA infections. Performed at [facility], [address] [place], [place] [address]     RADIOLOGY:  US Venous Img Lower Unilateral Left  Result Date: 10/18/2017 CLINICAL DATA:  Left lower extremity pain and edema. Evaluate for DVT. EXAM: LEFT LOWER EXTREMITY VENOUS DOPPLER ULTRASOUND TECHNIQUE: Gray-scale sonography with graded compression, as well as color Doppler and duplex ultrasound were performed to evaluate the lower extremity deep venous systems from the level of the common femoral vein and including the common femoral, femoral, profunda femoral, popliteal and calf veins including the posterior tibial, peroneal and gastrocnemius veins when visible. The superficial great saphenous vein was also interrogated. Spectral Doppler was utilized to evaluate flow at rest and with distal augmentation maneuvers in the common femoral, femoral and popliteal veins. COMPARISON:  Left lower extremity venous Doppler ultrasound - 09/22/2017 FINDINGS: Contralateral Common Femoral Vein: Respiratory phasicity is normal and symmetric with the symptomatic side. No evidence of thrombus. Normal compressibility. Common Femoral Vein: No evidence of thrombus. Normal compressibility, respiratory phasicity and response to augmentation. Saphenofemoral Junction: No evidence of thrombus. Normal compressibility and flow on color Doppler imaging. Profunda Femoral Vein: No evidence of  thrombus. Normal compressibility and flow on color Doppler imaging. Femoral Vein: No evidence of thrombus. Normal compressibility, respiratory phasicity and response to augmentation. Popliteal Vein: No evidence of thrombus. Normal compressibility, respiratory phasicity and response to augmentation. Calf Veins: Not well visualized Superficial Great Saphenous Vein: No evidence of thrombus. Normal compressibility. Venous Reflux:  None. Other Findings: There is an approximately 2.7 x 1.7 x 1.1 cm serpiginous anechoic fluid collection with the left popliteal fossa compatible with a Baker cyst. A moderate amount of subcutaneous edema is noted at the level of the left calf. IMPRESSION: 1.  No evidence of DVT within the left lower extremity. 2. Incidentally noted approximately 2.7 cm left-sided Baker's cyst. 3. Moderate amount of subcutaneous edema noted at the level the left calf. Electronically Signed   By: Sandi Mariscal M.D.   On: 10/18/2017 14:02      Management plans discussed with the patient, family and they are in agreement.  CODE STATUS:     Code Status Orders  (From admission, onward)        Start     Ordered   10/18/17 1413  Do not attempt resuscitation (DNR)  Continuous    Question Answer Comment  In the event of cardiac or respiratory ARREST Do not call a "code blue"   In the event of cardiac or respiratory ARREST Do not perform Intubation, CPR, defibrillation or ACLS   In the event of cardiac or respiratory ARREST Use medication by any route, position, wound care, and other measures to relive pain and suffering. May use oxygen, suction and manual treatment of airway obstruction as needed for comfort.      10/18/17 1412      TOTAL TIME TAKING CARE OF THIS PATIENT: 40 minutes.    Henreitta Leber M.D on 10/20/2017 at 11:33 AM  Between 7am to 6pm - Pager - [phone]  After 6pm go to [email or web address] - Proofreader  [facility]  Office   [phone]  CC: Primary care physician; Adin Hector, MD

## 2017-10-21 LAB — TYPE AND SCREEN
ABO/RH(D): B POS
ANTIBODY SCREEN: NEGATIVE
UNIT DIVISION: 0
Unit division: 0

## 2017-10-21 LAB — BPAM RBC
Blood Product Expiration Date: 201907252359
Blood Product Expiration Date: 201907262359
ISSUE DATE / TIME: 201907071218
UNIT TYPE AND RH: 7300
Unit Type and Rh: 7300

## 2017-10-21 LAB — PREPARE RBC (CROSSMATCH)

## 2019-11-18 ENCOUNTER — Emergency Department: Payer: Medicare Other

## 2019-11-18 ENCOUNTER — Other Ambulatory Visit: Payer: Self-pay

## 2019-11-18 ENCOUNTER — Emergency Department
Admission: EM | Admit: 2019-11-18 | Discharge: 2019-11-18 | Disposition: A | Discharge status: Home / Self Care | Payer: Medicare Other | Attending: Emergency Medicine | Admitting: Emergency Medicine

## 2019-11-18 DIAGNOSIS — Y939 Activity, unspecified: Secondary | ICD-10-CM | POA: Diagnosis not present

## 2019-11-18 DIAGNOSIS — C801 Malignant (primary) neoplasm, unspecified: Secondary | ICD-10-CM | POA: Insufficient documentation

## 2019-11-18 DIAGNOSIS — Z79899 Other long term (current) drug therapy: Secondary | ICD-10-CM | POA: Insufficient documentation

## 2019-11-18 DIAGNOSIS — S6992XA Unspecified injury of left wrist, hand and finger(s), initial encounter: Secondary | ICD-10-CM | POA: Diagnosis present

## 2019-11-18 DIAGNOSIS — W19XXXA Unspecified fall, initial encounter: Secondary | ICD-10-CM | POA: Insufficient documentation

## 2019-11-18 DIAGNOSIS — I10 Essential (primary) hypertension: Secondary | ICD-10-CM | POA: Diagnosis not present

## 2019-11-18 DIAGNOSIS — Y929 Unspecified place or not applicable: Secondary | ICD-10-CM | POA: Insufficient documentation

## 2019-11-18 DIAGNOSIS — S61512A Laceration without foreign body of left wrist, initial encounter: Secondary | ICD-10-CM | POA: Insufficient documentation

## 2019-11-18 DIAGNOSIS — Y999 Unspecified external cause status: Secondary | ICD-10-CM | POA: Insufficient documentation

## 2019-11-18 LAB — COMPREHENSIVE METABOLIC PANEL
ALT: 10 U/L (ref 0–44)
AST: 15 U/L (ref 15–41)
Albumin: 3.7 g/dL (ref 3.5–5.0)
Alkaline Phosphatase: 63 U/L (ref 38–126)
Anion gap: 10 (ref 5–15)
BUN: 46 mg/dL — ABNORMAL HIGH (ref 8–23)
CO2: 25 mmol/L (ref 22–32)
Calcium: 8.7 mg/dL — ABNORMAL LOW (ref 8.9–10.3)
Chloride: 104 mmol/L (ref 98–111)
Creatinine, Ser: 1.73 mg/dL — ABNORMAL HIGH (ref 0.44–1.00)
GFR calc Af Amer: 28 mL/min — ABNORMAL LOW (ref >60)
GFR calc non Af Amer: 24 mL/min — ABNORMAL LOW (ref >60)
Glucose, Bld: 109 mg/dL — ABNORMAL HIGH (ref 70–99)
Potassium: 4.4 mmol/L (ref 3.5–5.1)
Sodium: 139 mmol/L (ref 135–145)
Total Bilirubin: 1.1 mg/dL (ref 0.3–1.2)
Total Protein: 6 g/dL — ABNORMAL LOW (ref 6.5–8.1)

## 2019-11-18 LAB — CBC WITH DIFFERENTIAL/PLATELET
Abs Immature Granulocytes: 0.03 10*3/uL (ref 0.00–0.07)
Basophils Absolute: 0.1 10*3/uL (ref 0.0–0.1)
Basophils Relative: 1 %
Eosinophils Absolute: 0.2 10*3/uL (ref 0.0–0.5)
Eosinophils Relative: 2 %
HCT: 29 % — ABNORMAL LOW (ref 36.0–46.0)
Hemoglobin: 10.1 g/dL — ABNORMAL LOW (ref 12.0–15.0)
Immature Granulocytes: 0 %
Lymphocytes Relative: 17 %
Lymphs Abs: 1.4 10*3/uL (ref 0.7–4.0)
MCH: 39.5 pg — ABNORMAL HIGH (ref 26.0–34.0)
MCHC: 34.8 g/dL (ref 30.0–36.0)
MCV: 113.3 fL — ABNORMAL HIGH (ref 80.0–100.0)
Monocytes Absolute: 0.6 10*3/uL (ref 0.1–1.0)
Monocytes Relative: 8 %
Neutro Abs: 5.8 10*3/uL (ref 1.7–7.7)
Neutrophils Relative %: 72 %
Platelets: 202 10*3/uL (ref 150–400)
RBC: 2.56 MIL/uL — ABNORMAL LOW (ref 3.87–5.11)
RDW: 15 % (ref 11.5–15.5)
Smear Review: NORMAL
WBC: 8.1 10*3/uL (ref 4.0–10.5)
nRBC: 0 % (ref 0.0–0.2)

## 2019-11-18 LAB — GLUCOSE, CAPILLARY: Glucose-Capillary: 106 mg/dL — ABNORMAL HIGH (ref 70–99)

## 2019-11-18 NOTE — ED Triage Notes (Signed)
Per EMS, pt had an unwitnessed fall w/ c/o left hip pain and left wrist pain. Left wrist skin tear noted. No rotation or shortening of leg noted. Unknown if pt had LOC per EMS, no BGL reported by EMS, unknown if pt takes blood thinner- none listed in paperwork.  Pt sleeping- awakes to touch, is oriented to self and situation only.

## 2019-11-18 NOTE — ED Notes (Signed)
Pt assisted to toilet. Left hand and wrist re-wrapped with gauze dressing.

## 2019-11-18 NOTE — Discharge Instructions (Signed)
Please follow-up with Dr. Caryl Comes to make sure that the mass that appears to be pushing up on her left side of her lung is stable as we think it is.  Also have her care providers follow-up carefully on her blood pressure.

## 2019-11-18 NOTE — ED Provider Notes (Signed)
[facility] Emergency Department Provider Note   ____________________________________________   First MD Initiated Contact with Patient 11/18/19 1549     (approximate)  I have reviewed the triage vital signs and the nursing notes.   HISTORY  Chief Complaint Fall    HPI Karen Bartlett is a 86 y.o. female who had an unwitnessed fall at her place of [place].  She is unable to provide history herself.  She is only oriented to person.  She does complain of pain in her left wrist and left hip.  Family later arrives.  He says she is at her baseline.  She has moderate dementia.         Past Medical History:  Diagnosis Date  . Hypertension   . Ovarian cyst     Patient Active Problem List   Diagnosis Date Noted  . GI bleed 10/18/2017  . Anemia due to blood loss, acute 10/18/2017  . Absolute anemia 02/12/2015  . Arthritis, degenerative 02/12/2015  . Acid reflux 02/12/2015  . BP (high blood pressure) 02/12/2015  . Adrenal mass ([place]) 02/12/2015  . Malignant melanoma ([place]) 02/12/2015  . OP (osteoporosis) 02/12/2015  . Allergic rhinitis, seasonal 02/12/2015  . Amnesia 04/05/2014    History reviewed. No pertinent surgical history.  Prior to Admission medications   Medication Sig Start Date End Date Taking? Authorizing Provider  acetaminophen (PHARBETOL) 325 MG tablet Take 325 mg by mouth every 6 (six) hours as needed for mild pain or moderate pain.  01/15/16   [provider]  ALPRAZolam Duanne Moron) 0.25 MG tablet Take 0.25 mg by mouth at bedtime. (and 1 tablet daily as needed for anxiety)    [provider]  amLODipine (NORVASC) 5 MG tablet Take 5 mg by mouth daily.    [provider]  Apoaequorin (PREVAGEN) 10 MG CAPS Take 10 mg by mouth every morning.    [provider]  calcium carbonate (OS-CAL - DOSED IN MG OF ELEMENTAL CALCIUM) 1250 (500 Ca) MG tablet Take 1 tablet by mouth.    [provider]    Cholecalciferol (VITAMIN D-1000 MAX ST) 1000 UNITS tablet Take 1,000 Units by mouth daily.     [provider]  Cyanocobalamin (RA VITAMIN B-12 TR) 1000 MCG TBCR Take 1,000 mcg by mouth daily.     [provider]  fluticasone (FLONASE) 50 MCG/ACT nasal spray INSTILL 2 SPRAYS IN EACH NOSTRIL ONCE DAILY AS NEEDED. 03/22/14   [provider]  Multiple Vitamin (MULTI-VITAMINS) TABS Take 1 tablet by mouth daily.     [provider]  Multiple Vitamins-Minerals (PRESERVISION AREDS) CAPS Take 1 capsule by mouth daily.    [provider]  pantoprazole (PROTONIX) 40 MG tablet Take 1 tablet (40 mg total) by mouth daily. 10/20/17   Henreitta Leber, MD  Polyethylene Glycol 3350 (PEG 3350) PACK Take 17 g by mouth daily as needed (constipation).  05/13/17   [provider]  Psyllium (GNP NATURAL FIBER) 28.3 % POWD Mix 2 teaspoonful in fluid and take by mouth at bedtime 07/20/17   [provider]  sodium chloride (OCEAN) 0.65 % SOLN nasal spray Place 1 spray into both nostrils as needed for congestion.    [provider]  telmisartan (MICARDIS) 80 MG tablet TAKE 1 TABLET BY MOUTH ONCE DAILY 11/28/13   [provider]  traMADol (ULTRAM) 50 MG tablet Take 50 mg by mouth at bedtime.    [provider]  vitamin E 1000 UNIT  capsule Take 1,000 Units by mouth daily.    [provider]    Allergies Ace inhibitors, Bisphosphonates, and Esomeprazole magnesium  Family History  Problem Relation Age of Onset  . Hypertension Mother     Social History Social History   Tobacco Use  . Smoking status: Never Smoker  . Smokeless tobacco: Never Used  Substance Use Topics  . Alcohol use: No  . Drug use: Not on file    Review of Systems  Constitutional: No fever/chills Eyes: No visual changes. ENT: No sore throat. Cardiovascular: Denies chest pain. Respiratory: Denies shortness of breath. Gastrointestinal: No abdominal  pain.  No nausea, no vomiting.  No diarrhea.  No constipation. Genitourinary: Negative for dysuria. Musculoskeletal: Negative for back pain. Skin: Negative for rash. Neurological: Negative for headaches, focal weakness   ____________________________________________   PHYSICAL EXAM:  VITAL SIGNS: ED Triage Vitals [11/18/19 1537]  Enc Vitals Group     BP (!) 197/71     Pulse Rate 65     Resp 18     Temp 98.4 F (36.9 C)     Temp Source Oral     SpO2 97 %     Weight 100 lb (45.4 kg)     Height 5\' 4"  (1.626 m)     Head Circumference      Peak Flow      Pain Score 3     Pain Loc      Pain Edu?      Excl. in [place]?     Constitutional: Alert and oriented to person. Well appearing and in no acute distress. Eyes: Conjunctivae are normal. PER. EOMI. Head: Atraumatic. Nose: No congestion/rhinnorhea. Mouth/Throat: Mucous membranes are moist.  Oropharynx non-erythematous. Neck: No stridor. Cardiovascular: Normal rate, regular rhythm. Grossly normal heart sounds.  Good peripheral circulation. Respiratory: Normal respiratory effort.  No retractions. Lungs CTAB. Gastrointestinal: Soft and nontender. No distention. No abdominal bruits. No CVA tenderness. Musculoskeletal: No lower extremity tenderness except for possibly in the left hip.  No joint effusions. Neurologic:   No gross focal neurologic deficits are appreciated except for the patient is very hard of hearing.  Skin:  Skin is warm, dry and intact except for skin tear on left wrist. No rash noted.   ____________________________________________   LABS (all labs ordered are listed, but only abnormal results are displayed)  Labs Reviewed  COMPREHENSIVE METABOLIC PANEL - Abnormal; Notable for the following components:      Result Value   Glucose, Bld 109 (*)    BUN 46 (*)    Creatinine, Ser 1.73 (*)    Calcium 8.7 (*)    Total Protein 6.0 (*)    GFR calc non Af Amer 24 (*)    GFR calc Af Amer 28 (*)    All other components  within normal limits  CBC WITH DIFFERENTIAL/PLATELET - Abnormal; Notable for the following components:   RBC 2.56 (*)    Hemoglobin 10.1 (*)    HCT 29.0 (*)    MCV 113.3 (*)    MCH 39.5 (*)    All other components within normal limits  GLUCOSE, CAPILLARY - Abnormal; Notable for the following components:   Glucose-Capillary 106 (*)    All other components within normal limits  URINALYSIS, COMPLETE (UACMP) WITH MICROSCOPIC   ____________________________________________  EKG   ____________________________________________  RADIOLOGY  ED MD interpretation: Chest x-ray shows a possible effusion in the left lung base.  Family reports that she does have a mass there  and appears to be a mass pushing up on the CT done in 2012.  This apparently has been growing slowly.  Family will follow up with Dr. Caryl Comes her primary care for this.  She is in no distress at this time.  X-ray of the hip and wrist show no acute fracture.  CT the head and neck also showed no acute fracture.  Radiology read the films and I reviewed them.   Official radiology report(s): DG Wrist Complete Left  Result Date: 11/18/2019 CLINICAL DATA:  Fall with wrist pain EXAM: LEFT WRIST - COMPLETE 3+ VIEW COMPARISON:  None. FINDINGS: No acute displaced fracture or malalignment. Advanced arthritis at the first [facility] joint. Vascular calcifications. IMPRESSION: No acute osseous abnormality. Electronically Signed   By: Donavan Foil M.D.   On: 11/18/2019 16:30   CT Head Wo Contrast  Result Date: 11/18/2019 CLINICAL DATA:  Unwitnessed fall EXAM: CT HEAD WITHOUT CONTRAST CT CERVICAL SPINE WITHOUT CONTRAST TECHNIQUE: Multidetector CT imaging of the head and cervical spine was performed following the standard protocol without intravenous contrast. Multiplanar CT image reconstructions of the cervical spine were also generated. COMPARISON:  None. FINDINGS: CT HEAD FINDINGS Brain: Hypoattenuating foci in the bilateral external capsules likely  reflecting areas of punctate remote lacunar type infarcts. No evidence of acute infarction, hemorrhage, hydrocephalus, extra-axial collection or mass lesion/mass effect. Symmetric prominence of the ventricles, cisterns and sulci compatible with parenchymal volume loss. Patchy areas of white matter hypoattenuation are most compatible with chronic microvascular angiopathy. Benign dural calcifications are present. Vascular: Atherosclerotic calcification of the carotid siphons and intradural vertebral arteries. No hyperdense vessel. Skull: No significant scalp swelling, large hematoma or calvarial fracture. No visible acute facial bone fracture within the included margins of imaging. Sinuses/Orbits: Likely benign small sinus osteomas in the inferior frontal recesses. Minimal thickening in the ethmoids. Paranasal sinuses are otherwise predominantly clear. Mastoid air cells are clear with asymmetric hypopneumatization of the right mastoid. Middle ear cavities are clear. Ossicular chains appear grossly normal. Other: Mild bilateral TMJ arthrosis. CT CERVICAL SPINE FINDINGS Alignment: Stabilization collar is absent at the time of examination. There is straightening and focal reversal of the normal cervical lordosis with an apex at C5-6. 3 mm of anterolisthesis C4 on 5 favored to be on a degenerative basis given the spondylitic changes at this level without additional features of acute traumatic listhesis at this location or elsewhere within the spine such as abnormally widened, perched or jumped facets. Bony fusion across the C3-4 facets on the left. Grossly normal alignment of the craniocervical and atlantoaxial articulations accounting for advanced arthrosis at the atlantodental interval and mild rightward cranial rotation. Skull base and vertebrae: The osseous structures appear diffusely demineralized which may limit detection of small or nondisplaced fractures. No acute skull base fracture. No vertebral body fracture or  height loss. Normal bone mineralization. No worrisome osseous lesions. Moderate arthrosis at the atlantodental and basion-dens intervals. Soft tissues and spinal canal: No pre or paravertebral fluid or swelling. No visible canal hematoma. Disc levels: Multilevel intervertebral disc height loss with spondylitic endplate changes. Multilevel disc osteophyte complexes result in some ventral thecal sac effacement but without severe canal stenosis. At least mild canal stenosis is noted however at the C4-5 level given the anterolisthesis and posterior ligamentum flavum thickening. There is multilevel uncinate spurring and hypertrophic facet degenerative changes throughout the cervical spine resulting in some mild to moderate multilevel foraminal narrowing with more severe narrowing on the left at C4-5 exacerbated by the anterolisthesis. Upper  chest: Biapical pleuroparenchymal scarring including calcifications of the pleura towards the left apex. Calcification throughout the cervical carotids and proximal great vessels as well. Other: Subcentimeter hypoattenuating nodule in the left lobe thyroid gland, likely benign in a patient of this age. No further evaluation is warranted particularly given patient's age. This follows consensus guidelines: Managing Incidental Thyroid Nodules Detected on Imaging: White Paper of the ACR Incidental Thyroid Findings Committee. J Am Coll Radiol 2015; 12:143-150. and [facility] 3-tiered system for managing ITNs: J Am Coll Radiol. 2015; Feb;12(2): 143-50 IMPRESSION: 1. No evidence of acute intracranial abnormality. 2. No significant scalp swelling or calvarial fracture. 3. No evidence of acute fracture or traumatic listhesis of the cervical spine. 4. Chronic microvascular angiopathy and parenchymal volume loss. 5. Likely remote lacunar type infarcts in the bilateral external capsules. 6. Multilevel degenerative changes of the cervical spine, as described above. 7. Intracranial and cervical  atherosclerosis. Electronically Signed   By: Lovena Le M.D.   On: 11/18/2019 16:17   CT Cervical Spine Wo Contrast  Result Date: 11/18/2019 CLINICAL DATA:  Unwitnessed fall EXAM: CT HEAD WITHOUT CONTRAST CT CERVICAL SPINE WITHOUT CONTRAST TECHNIQUE: Multidetector CT imaging of the head and cervical spine was performed following the standard protocol without intravenous contrast. Multiplanar CT image reconstructions of the cervical spine were also generated. COMPARISON:  None. FINDINGS: CT HEAD FINDINGS Brain: Hypoattenuating foci in the bilateral external capsules likely reflecting areas of punctate remote lacunar type infarcts. No evidence of acute infarction, hemorrhage, hydrocephalus, extra-axial collection or mass lesion/mass effect. Symmetric prominence of the ventricles, cisterns and sulci compatible with parenchymal volume loss. Patchy areas of white matter hypoattenuation are most compatible with chronic microvascular angiopathy. Benign dural calcifications are present. Vascular: Atherosclerotic calcification of the carotid siphons and intradural vertebral arteries. No hyperdense vessel. Skull: No significant scalp swelling, large hematoma or calvarial fracture. No visible acute facial bone fracture within the included margins of imaging. Sinuses/Orbits: Likely benign small sinus osteomas in the inferior frontal recesses. Minimal thickening in the ethmoids. Paranasal sinuses are otherwise predominantly clear. Mastoid air cells are clear with asymmetric hypopneumatization of the right mastoid. Middle ear cavities are clear. Ossicular chains appear grossly normal. Other: Mild bilateral TMJ arthrosis. CT CERVICAL SPINE FINDINGS Alignment: Stabilization collar is absent at the time of examination. There is straightening and focal reversal of the normal cervical lordosis with an apex at C5-6. 3 mm of anterolisthesis C4 on 5 favored to be on a degenerative basis given the spondylitic changes at this level  without additional features of acute traumatic listhesis at this location or elsewhere within the spine such as abnormally widened, perched or jumped facets. Bony fusion across the C3-4 facets on the left. Grossly normal alignment of the craniocervical and atlantoaxial articulations accounting for advanced arthrosis at the atlantodental interval and mild rightward cranial rotation. Skull base and vertebrae: The osseous structures appear diffusely demineralized which may limit detection of small or nondisplaced fractures. No acute skull base fracture. No vertebral body fracture or height loss. Normal bone mineralization. No worrisome osseous lesions. Moderate arthrosis at the atlantodental and basion-dens intervals. Soft tissues and spinal canal: No pre or paravertebral fluid or swelling. No visible canal hematoma. Disc levels: Multilevel intervertebral disc height loss with spondylitic endplate changes. Multilevel disc osteophyte complexes result in some ventral thecal sac effacement but without severe canal stenosis. At least mild canal stenosis is noted however at the C4-5 level given the anterolisthesis and posterior ligamentum flavum thickening. There is multilevel uncinate spurring and  hypertrophic facet degenerative changes throughout the cervical spine resulting in some mild to moderate multilevel foraminal narrowing with more severe narrowing on the left at C4-5 exacerbated by the anterolisthesis. Upper chest: Biapical pleuroparenchymal scarring including calcifications of the pleura towards the left apex. Calcification throughout the cervical carotids and proximal great vessels as well. Other: Subcentimeter hypoattenuating nodule in the left lobe thyroid gland, likely benign in a patient of this age. No further evaluation is warranted particularly given patient's age. This follows consensus guidelines: Managing Incidental Thyroid Nodules Detected on Imaging: White Paper of the ACR Incidental Thyroid  Findings Committee. J Am Coll Radiol 2015; 12:143-150. and [employer] 3-tiered system for managing ITNs: J Am Coll Radiol. 2015; Feb;12(2): 143-50 IMPRESSION: 1. No evidence of acute intracranial abnormality. 2. No significant scalp swelling or calvarial fracture. 3. No evidence of acute fracture or traumatic listhesis of the cervical spine. 4. Chronic microvascular angiopathy and parenchymal volume loss. 5. Likely remote lacunar type infarcts in the bilateral external capsules. 6. Multilevel degenerative changes of the cervical spine, as described above. 7. Intracranial and cervical atherosclerosis. Electronically Signed   By: Lovena Le M.D.   On: 11/18/2019 16:17   DG Chest Port 1 View  Result Date: 11/18/2019 CLINICAL DATA:  Fall EXAM: PORTABLE CHEST 1 VIEW COMPARISON:  None. FINDINGS: Small moderate left pleural effusion with basilar airspace disease. Probable tiny right pleural effusion. Mild cardiomegaly with aortic atherosclerosis. No pneumothorax. Dextroscoliosis of the spine. IMPRESSION: Small to moderate left pleural effusion with basilar airspace disease, atelectasis versus pneumonia. Probable tiny right pleural effusion. Electronically Signed   By: Donavan Foil M.D.   On: 11/18/2019 16:31   DG Hip Unilat W or Wo Pelvis 2-3 Views Left  Result Date: 11/18/2019 CLINICAL DATA:  Unwitnessed fall. EXAM: DG HIP (WITH OR WITHOUT PELVIS) 2-3V LEFT COMPARISON:  October 16, 2014 FINDINGS: There is no evidence of acute hip fracture or dislocation. A chronic appearing deformity is seen along the lesser trochanter of the proximal right femur. Mild degenerative changes are seen involving both hips, with additional degenerative changes noted within the visualized portion of the lower lumbar spine. There is no evidence of arthropathy or other focal bone abnormality. IMPRESSION: No acute osseous abnormality. Electronically Signed   By: Virgina Norfolk M.D.   On: 11/18/2019 16:30     ____________________________________________   PROCEDURES  Procedure(s) performed (including Critical Care):  Procedures   ____________________________________________   INITIAL IMPRESSION / ASSESSMENT AND PLAN / ED COURSE  Family member feel she is at baseline.  He wants to get her back to her familiar surroundings before she begins to [place].  I agree with this.  Patient is moving all her extremities equally and well without any difficulty.  We will just keep the dressing on the wrist skin tear.  Patient will follow up with primary care.  She make sure she will get her all of her medicines that should work on her blood pressure.  Also being in a familiar environment will help her relax and get her blood pressure down as well.              ____________________________________________   FINAL CLINICAL IMPRESSION(S) / ED DIAGNOSES  Final diagnoses:  Fall, initial encounter     ED Discharge Orders    None       Note:  This document was prepared using Dragon voice recognition software and may include unintentional dictation errors.    Nena Polio, MD 11/18/19 [phone]

## 2019-11-18 NOTE — ED Notes (Signed)
Son in room- states pt has early onset dementia and does get confused in hospital setting, also reports pt uses a walker to ambulate. Son states he would like pt to be discharged back ASAP. Will notify MD.

## 2019-11-18 NOTE — ED Notes (Signed)
Per [facility], Pt in assisted living, does not take blood thinners, and ambulates independently.

## 2022-06-13 DEATH — deceased
